# Patient Record
Sex: Female | Born: 1940
Health system: Southern US, Community
[De-identification: ages and names within clinical notes are randomized; demographics above are authoritative.]

## PROBLEM LIST (undated history)

## (undated) DIAGNOSIS — R7303 Prediabetes: Secondary | ICD-10-CM

## (undated) DIAGNOSIS — I4891 Unspecified atrial fibrillation: Secondary | ICD-10-CM

## (undated) DIAGNOSIS — K219 Gastro-esophageal reflux disease without esophagitis: Secondary | ICD-10-CM

## (undated) DIAGNOSIS — N183 Chronic kidney disease, stage 3 unspecified: Secondary | ICD-10-CM

## (undated) DIAGNOSIS — I10 Essential (primary) hypertension: Secondary | ICD-10-CM

## (undated) DIAGNOSIS — I480 Paroxysmal atrial fibrillation: Secondary | ICD-10-CM

## (undated) HISTORY — DX: Unspecified atrial fibrillation: I48.91

## (undated) HISTORY — DX: Paroxysmal atrial fibrillation: I48.0

## (undated) HISTORY — DX: Prediabetes: R73.03

## (undated) HISTORY — DX: Gastro-esophageal reflux disease without esophagitis: K21.9

## (undated) HISTORY — DX: Chronic kidney disease, stage 3 unspecified: N18.30

---

## 1998-05-14 ENCOUNTER — Other Ambulatory Visit: Admission: RE | Admit: 1998-05-14 | Discharge: 1998-05-14 | Payer: Self-pay | Admitting: Obstetrics and Gynecology

## 2003-07-18 ENCOUNTER — Encounter: Admission: RE | Admit: 2003-07-18 | Discharge: 2003-07-18 | Payer: Self-pay | Admitting: Family Medicine

## 2003-07-18 ENCOUNTER — Encounter: Payer: Self-pay | Admitting: Family Medicine

## 2004-03-13 ENCOUNTER — Ambulatory Visit (HOSPITAL_COMMUNITY): Admission: RE | Admit: 2004-03-13 | Discharge: 2004-03-13 | Payer: Self-pay | Admitting: Orthopedic Surgery

## 2012-05-02 ENCOUNTER — Other Ambulatory Visit: Payer: Self-pay | Admitting: Family Medicine

## 2012-05-05 ENCOUNTER — Ambulatory Visit
Admission: RE | Admit: 2012-05-05 | Discharge: 2012-05-05 | Disposition: A | Discharge status: Home / Self Care | Payer: BC Managed Care – PPO | Source: Ambulatory Visit | Attending: Family Medicine | Admitting: Family Medicine

## 2016-06-01 ENCOUNTER — Emergency Department (HOSPITAL_COMMUNITY): Payer: Medicare Other

## 2016-06-01 ENCOUNTER — Emergency Department (HOSPITAL_COMMUNITY)
Admission: EM | Admit: 2016-06-01 | Discharge: 2016-06-01 | Disposition: A | Discharge status: Home / Self Care | Payer: Medicare Other | Attending: Emergency Medicine | Admitting: Emergency Medicine

## 2016-06-01 ENCOUNTER — Encounter (HOSPITAL_COMMUNITY): Payer: Self-pay | Admitting: Emergency Medicine

## 2016-06-01 DIAGNOSIS — Y92410 Unspecified street and highway as the place of occurrence of the external cause: Secondary | ICD-10-CM | POA: Insufficient documentation

## 2016-06-01 DIAGNOSIS — Y939 Activity, unspecified: Secondary | ICD-10-CM | POA: Diagnosis not present

## 2016-06-01 DIAGNOSIS — S20212A Contusion of left front wall of thorax, initial encounter: Secondary | ICD-10-CM | POA: Diagnosis not present

## 2016-06-01 DIAGNOSIS — Z79899 Other long term (current) drug therapy: Secondary | ICD-10-CM | POA: Diagnosis not present

## 2016-06-01 DIAGNOSIS — Z7982 Long term (current) use of aspirin: Secondary | ICD-10-CM | POA: Diagnosis not present

## 2016-06-01 DIAGNOSIS — S299XXA Unspecified injury of thorax, initial encounter: Secondary | ICD-10-CM | POA: Diagnosis present

## 2016-06-01 DIAGNOSIS — Y999 Unspecified external cause status: Secondary | ICD-10-CM | POA: Diagnosis not present

## 2016-06-01 DIAGNOSIS — I10 Essential (primary) hypertension: Secondary | ICD-10-CM | POA: Diagnosis not present

## 2016-06-01 HISTORY — DX: Essential (primary) hypertension: I10

## 2016-06-01 LAB — CBC WITH DIFFERENTIAL/PLATELET
BASOS ABS: 0 10*3/uL (ref 0.0–0.1)
BASOS PCT: 0 %
Eosinophils Absolute: 0.1 10*3/uL (ref 0.0–0.7)
Eosinophils Relative: 1 %
HEMATOCRIT: 40.7 % (ref 36.0–46.0)
HEMOGLOBIN: 13.6 g/dL (ref 12.0–15.0)
Lymphocytes Relative: 38 %
Lymphs Abs: 2.4 10*3/uL (ref 0.7–4.0)
MCH: 28.2 pg (ref 26.0–34.0)
MCHC: 33.4 g/dL (ref 30.0–36.0)
MCV: 84.4 fL (ref 78.0–100.0)
Monocytes Absolute: 0.5 10*3/uL (ref 0.1–1.0)
Monocytes Relative: 8 %
NEUTROS ABS: 3.3 10*3/uL (ref 1.7–7.7)
NEUTROS PCT: 53 %
Platelets: 223 10*3/uL (ref 150–400)
RBC: 4.82 MIL/uL (ref 3.87–5.11)
RDW: 12.6 % (ref 11.5–15.5)
WBC: 6.3 10*3/uL (ref 4.0–10.5)

## 2016-06-01 LAB — BASIC METABOLIC PANEL
ANION GAP: 5 (ref 5–15)
BUN: 14 mg/dL (ref 6–20)
CALCIUM: 9.1 mg/dL (ref 8.9–10.3)
CHLORIDE: 108 mmol/L (ref 101–111)
CO2: 26 mmol/L (ref 22–32)
Creatinine, Ser: 1.25 mg/dL — ABNORMAL HIGH (ref 0.44–1.00)
GFR calc non Af Amer: 41 mL/min — ABNORMAL LOW (ref >60)
GFR, EST AFRICAN AMERICAN: 48 mL/min — AB (ref >60)
Glucose, Bld: 124 mg/dL — ABNORMAL HIGH (ref 65–99)
Potassium: 3.9 mmol/L (ref 3.5–5.1)
Sodium: 139 mmol/L (ref 135–145)

## 2016-06-01 LAB — TROPONIN I: Troponin I: <0.03 ng/mL (ref <0.03)

## 2016-06-01 MED ORDER — NAPROXEN 375 MG PO TABS: 375.0000 mg | ORAL_TABLET | Freq: Two times a day (BID) | ORAL | Status: DC
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
## 2016-06-01 NOTE — ED Notes (Signed)
MD at bedside. 
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
## 2018-01-17 ENCOUNTER — Ambulatory Visit: Payer: Self-pay | Admitting: Podiatry

## 2018-01-24 ENCOUNTER — Ambulatory Visit: Payer: Self-pay | Admitting: Podiatry

## 2018-01-31 ENCOUNTER — Ambulatory Visit: Payer: Medicare Other | Admitting: Podiatry

## 2018-01-31 ENCOUNTER — Encounter: Payer: Self-pay | Admitting: Podiatry

## 2018-01-31 ENCOUNTER — Ambulatory Visit (INDEPENDENT_AMBULATORY_CARE_PROVIDER_SITE_OTHER): Payer: Medicare Other

## 2018-01-31 VITALS — BP 110/64 | HR 58 | Resp 18

## 2018-01-31 DIAGNOSIS — M2010 Hallux valgus (acquired), unspecified foot: Secondary | ICD-10-CM | POA: Diagnosis not present

## 2018-01-31 DIAGNOSIS — M2041 Other hammer toe(s) (acquired), right foot: Secondary | ICD-10-CM

## 2018-01-31 DIAGNOSIS — M79674 Pain in right toe(s): Secondary | ICD-10-CM

## 2018-01-31 DIAGNOSIS — L84 Corns and callosities: Secondary | ICD-10-CM

## 2018-01-31 NOTE — Progress Notes (Signed)
   Subjective:    Patient ID: Elizabeth Russell, female    DOB: 1941-04-08, 77 y.o.   MRN: [id number]  HPI 77 year old female presents the office today for concerns of a corn to the top of her right second toe which is been ongoing for about 1 month.  She is tried some offloading pads but this is not been helping.  She denies any recent redness or drainage coming from the area she denies any swelling.  No recent injury or trauma.  No other concerns.   Review of Systems  All other systems reviewed and are negative.  Past Medical History:  Diagnosis Date  . Hypertension     History reviewed. No pertinent surgical history.   Current Outpatient Medications:  .  aspirin EC 81 MG tablet, Take 81 mg by mouth every other day., Disp: , Rfl:  .  losartan (COZAAR) 100 MG tablet, Take 100 mg by mouth daily., Disp: , Rfl:  .  naproxen (NAPROSYN) 375 MG tablet, Take 1 tablet (375 mg total) by mouth 2 (two) times daily. (Patient not taking: Reported on 01/31/2018), Disp: 20 tablet, Rfl: 0 .  pravastatin (PRAVACHOL) 80 MG tablet, Take 80 mg by mouth daily., Disp: , Rfl:   No Known Allergies  Social History   Socioeconomic History  . Marital status: Married    Spouse name: Not on file  . Number of children: Not on file  . Years of education: Not on file  . Highest education level: Not on file  Social Needs  . Financial resource strain: Not on file  . Food insecurity - worry: Not on file  . Food insecurity - inability: Not on file  . Transportation needs - medical: Not on file  . Transportation needs - non-medical: Not on file  Occupational History  . Not on file  Tobacco Use  . Smoking status: Never Smoker  . Smokeless tobacco: Never Used  Substance and Sexual Activity  . Alcohol use: No  . Drug use: No  . Sexual activity: Not on file  Other Topics Concern  . Not on file  Social History Narrative  . Not on file        Objective:   Physical Exam  General: AAO x3,  NAD  Dermatological: Hyperkeratotic lesion present the dorsal PIPJ.  Upon debridement there is no underlying ulceration, drainage or any clinical signs of infection present.  No other open lesions or pre-ulcerative lesions identified today.  Vascular: Dorsalis Pedis artery and Posterior Tibial artery pedal pulses are 2/4 bilateral with immedate capillary fill time. There is no pain with calf compression, swelling, warmth, erythema.   Neruologic: Grossly intact via light touch bilateral.  Protective threshold with Semmes Wienstein monofilament intact to all pedal sites bilateral.   Musculoskeletal: HAV is present as well as hammertoe deformity.  Muscular strength 5/5 in all groups tested bilateral.  Gait: Unassisted, Nonantalgic.     Assessment & Plan:  77 year old female with right second digit corn due to underlying hammertoe deformity -Treatment options discussed including all alternatives, risks, and complications -Etiology of symptoms were discussed -X-rays were obtained and reviewed with the patient.  HAV and hammertoes present.  No evidence of acute fracture. -I sharply debrided the hyperkeratotic lesion x1 without any complications or bleeding.  Dispensed offloading pads.  Discussed shoe gear modifications.  Vivi BarrackMatthew R Wagoner DPM

## 2018-01-31 NOTE — Patient Instructions (Signed)
Corns and Calluses Corns are small areas of thickened skin that occur on the top, sides, or tip of a toe. They contain a cone-shaped core with a point that can press on a nerve below. This causes pain. Calluses are areas of thickened skin that can occur anywhere on the body including hands, fingers, palms, soles of the feet, and heels.Calluses are usually larger than corns. What are the causes? Corns and calluses are caused by rubbing (friction) or pressure, such as from shoes that are too tight or do not fit properly. What increases the risk? Corns are more likely to develop in people who have toe deformities, such as hammer toes. Since calluses can occur with friction to any area of the skin, calluses are more likely to develop in people who:  Work with their hands.  Wear shoes that fit poorly, shoes that are too tight, or shoes that are high-heeled.  Have toes deformities.  What are the signs or symptoms? Symptoms of a corn or callus include:  A hard growth on the skin.  Pain or tenderness under the skin.  Redness and swelling.  Increased discomfort while wearing tight-fitting shoes.  How is this diagnosed? Corns and calluses may be diagnosed with a medical history and physical exam. How is this treated? Corns and calluses may be treated with:  Removing the cause of the friction or pressure. This may include: ? Changing your shoes. ? Wearing shoe inserts (orthotics) or other protective layers in your shoes, such as a corn pad. ? Wearing gloves.  Medicines to help soften skin in the hardened, thickened areas.  Reducing the size of the corn or callus by removing the dead layers of skin.  Antibiotic medicines to treat infection.  Surgery, if a toe deformity is the cause.  Follow these instructions at home:  Take medicines only as directed by your health care provider.  If you were prescribed an antibiotic, finish all of it even if you start to feel better.  Wear  shoes that fit well. Avoid wearing high-heeled shoes and shoes that are too tight or too loose.  Wear any padding, protective layers, gloves, or orthotics as directed by your health care provider.  Soak your hands or feet and then use a file or pumice stone to soften your corn or callus. Do this as directed by your health care provider.  Check your corn or callus every day for signs of infection. Watch for: ? Redness, swelling, or pain. ? Fluid, blood, or pus. Contact a health care provider if:  Your symptoms do not improve with treatment.  You have increased redness, swelling, or pain at the site of your corn or callus.  You have fluid, blood, or pus coming from your corn or callus.  You have new symptoms. This information is not intended to replace advice given to you by your health care provider. Make sure you discuss any questions you have with your health care provider. Document Released: 08/08/2004 Document Revised: 05/22/2016 Document Reviewed: 10/29/2014 Elsevier Interactive Patient Education  2018 Elsevier Inc.  Hammer Toe Hammer toe is a change in the shape (a deformity) of your second, third, or fourth toe. The deformity causes the middle joint of your toe to stay bent. This causes pain, especially when you are wearing shoes. Hammer toe starts gradually. At first, the toe can be straightened. Gradually over time, the deformity becomes stiff and permanent. Early treatments to keep the toe straight may relieve pain. As the deformity becomes stiff   and permanent, surgery may be needed to straighten the toe. What are the causes? Hammer toe is caused by abnormal bending of the toe joint that is closest to your foot. It happens gradually over time. This pulls on the muscles and connections (tendons) of the toe joint, making them weak and stiff. It is often related to wearing shoes that are too short or narrow and do not let your toes straighten. What increases the risk? You may be at  greater risk for hammer toe if you:  Are female.  Are older.  Wear shoes that are too small.  Wear high-heeled shoes that pinch your toes.  Are a ballet dancer.  Have a second toe that is longer than your big toe (first toe).  Injure your foot or toe.  Have arthritis.  Have a family history of hammer toe.  Have a nerve or muscle disorder.  What are the signs or symptoms? The main symptoms of this condition are pain and deformity of the toe. The pain is worse when wearing shoes, walking, or running. Other symptoms may include:  Corns or calluses over the bent part of the toe or between the toes.  Redness and a burning feeling on the toe.  An open sore that forms on the top of the toe.  Not being able to straighten the toe.  How is this diagnosed? This condition is diagnosed based on your symptoms and a physical exam. During the exam, your health care provider will try to straighten your toe to see how stiff the deformity is. You may also have tests, such as:  A blood test to check for rheumatoid arthritis.  An X-ray to show how severe the deformity is.  How is this treated? Treatment for this condition will depend on how stiff the deformity is. Surgery is often needed. However, sometimes a hammer toe can be straightened without surgery. Treatments that do not involve surgery include:  Taping the toe into a straightened position.  Using pads and cushions to protect the toe (orthotics).  Wearing shoes that provide enough room for the toes.  Doing toe-stretching exercises at home.  Taking an NSAID to reduce pain and swelling.  If these treatments do not help or the toe cannot be straightened, surgery is the next option. The most common surgeries used to straighten a hammer toe include:  Arthroplasty. In this procedure, part of the joint is removed, and that allows the toe to straighten.  Fusion. In this procedure, cartilage between the two bones of the joint is  taken out and the bones are fused together into one longer bone.  Implantation. In this procedure, part of the bone is removed and replaced with an implant to let the toe move again.  Flexor tendon transfer. In this procedure, the tendons that curl the toes down (flexor tendons) are repositioned.  Follow these instructions at home:  Take over-the-counter and prescription medicines only as told by your health care provider.  Do toe straightening and stretching exercises as told by your health care provider.  Keep all follow-up visits as told by your health care provider. This is important. How is this prevented?  Wear shoes that give your toes enough room and do not cause pain.  Do not wear high-heeled shoes. Contact a health care provider if:  Your pain gets worse.  Your toe becomes red or swollen.  You develop an open sore on your toe. This information is not intended to replace advice given to you   by your health care provider. Make sure you discuss any questions you have with your health care provider. Document Released: 10/30/2000 Document Revised: 05/22/2016 Document Reviewed: 02/26/2016 Elsevier Interactive Patient Education  2018 Elsevier Inc.  

## 2019-09-27 ENCOUNTER — Other Ambulatory Visit: Payer: Self-pay

## 2019-09-27 DIAGNOSIS — Z20822 Contact with and (suspected) exposure to covid-19: Secondary | ICD-10-CM

## 2019-09-28 ENCOUNTER — Telehealth: Payer: Self-pay | Admitting: General Practice

## 2019-09-28 LAB — NOVEL CORONAVIRUS, NAA: SARS-CoV-2, NAA: NOT DETECTED

## 2019-09-28 NOTE — Telephone Encounter (Signed)
Pt called in for covid result.  °Advised of Not Detected result.  °

## 2019-11-17 ENCOUNTER — Other Ambulatory Visit: Payer: Self-pay

## 2019-11-17 ENCOUNTER — Emergency Department (HOSPITAL_COMMUNITY): Payer: Medicare Other

## 2019-11-17 ENCOUNTER — Emergency Department (HOSPITAL_COMMUNITY)
Admission: EM | Admit: 2019-11-17 | Discharge: 2019-11-18 | Disposition: A | Discharge status: Home / Self Care | Payer: Medicare Other | Attending: Emergency Medicine | Admitting: Emergency Medicine

## 2019-11-17 ENCOUNTER — Encounter (HOSPITAL_COMMUNITY): Payer: Self-pay | Admitting: Emergency Medicine

## 2019-11-17 DIAGNOSIS — Z79899 Other long term (current) drug therapy: Secondary | ICD-10-CM | POA: Insufficient documentation

## 2019-11-17 DIAGNOSIS — R0789 Other chest pain: Secondary | ICD-10-CM | POA: Diagnosis present

## 2019-11-17 DIAGNOSIS — Z7982 Long term (current) use of aspirin: Secondary | ICD-10-CM | POA: Diagnosis not present

## 2019-11-17 DIAGNOSIS — R079 Chest pain, unspecified: Secondary | ICD-10-CM

## 2019-11-17 DIAGNOSIS — I10 Essential (primary) hypertension: Secondary | ICD-10-CM | POA: Diagnosis not present

## 2019-11-17 LAB — CBC
HCT: 43.7 % (ref 36.0–46.0)
Hemoglobin: 14.7 g/dL (ref 12.0–15.0)
MCH: 28.7 pg (ref 26.0–34.0)
MCHC: 33.6 g/dL (ref 30.0–36.0)
MCV: 85.4 fL (ref 80.0–100.0)
Platelets: 230 10*3/uL (ref 150–400)
RBC: 5.12 MIL/uL — ABNORMAL HIGH (ref 3.87–5.11)
RDW: 12.5 % (ref 11.5–15.5)
WBC: 7.3 10*3/uL (ref 4.0–10.5)
nRBC: 0 % (ref 0.0–0.2)

## 2019-11-17 LAB — BASIC METABOLIC PANEL
Anion gap: 9 (ref 5–15)
BUN: 13 mg/dL (ref 8–23)
CO2: 23 mmol/L (ref 22–32)
Calcium: 9.5 mg/dL (ref 8.9–10.3)
Chloride: 106 mmol/L (ref 98–111)
Creatinine, Ser: 1.13 mg/dL — ABNORMAL HIGH (ref 0.44–1.00)
GFR calc Af Amer: 54 mL/min — ABNORMAL LOW (ref >60)
GFR calc non Af Amer: 47 mL/min — ABNORMAL LOW (ref >60)
Glucose, Bld: 155 mg/dL — ABNORMAL HIGH (ref 70–99)
Potassium: 3.6 mmol/L (ref 3.5–5.1)
Sodium: 138 mmol/L (ref 135–145)

## 2019-11-17 LAB — TROPONIN I (HIGH SENSITIVITY): Troponin I (High Sensitivity): 12 ng/L (ref <18)

## 2019-11-17 MED ORDER — SODIUM CHLORIDE 0.9% FLUSH: 3.0000 mL | Freq: Once | INTRAVENOUS | Status: DC

## 2019-11-17 NOTE — ED Triage Notes (Signed)
Patient reports pain across her chest onset this evening , denies SOB , no emesis or diaphoresis , no cough or fever .

## 2019-11-18 ENCOUNTER — Encounter (HOSPITAL_COMMUNITY): Payer: Self-pay | Admitting: Emergency Medicine

## 2019-11-18 LAB — TROPONIN I (HIGH SENSITIVITY): Troponin I (High Sensitivity): 13 ng/L (ref <18)

## 2019-11-18 NOTE — ED Provider Notes (Signed)
[facility] EMERGENCY DEPARTMENT Provider Note   CSN: [id number] Arrival date & time: 11/17/19  2114     History Chief Complaint  Patient presents with  . Chest Pain    Elizabeth Russell is a 79 y.o. female.  The history is provided by the patient.  Chest Pain Pain location:  R chest Pain severity:  Mild Onset quality:  Sudden Duration:  12 hours Progression:  Resolved Chronicity:  New Context: eating   Relieved by:  Antacids Worsened by:  Nothing Associated symptoms: heartburn and palpitations   Associated symptoms: no abdominal pain, no altered mental status, no back pain, no cough, no fatigue, no fever, no shortness of breath and no vomiting   Risk factors: hypertension   Risk factors: no coronary artery disease, no diabetes mellitus, no high cholesterol, no prior DVT/PE and no smoking        Past Medical History:  Diagnosis Date  . Hypertension     There are no problems to display for this patient.   History reviewed. No pertinent surgical history.   OB History   No obstetric history on file.     History reviewed. No pertinent family history.  Social History   Tobacco Use  . Smoking status: Never Smoker  . Smokeless tobacco: Never Used  Substance Use Topics  . Alcohol use: No  . Drug use: No    Home Medications Prior to Admission medications   Medication Sig Start Date End Date Taking? Authorizing Provider  aspirin EC 81 MG tablet Take 81 mg by mouth every other day.    [provider]  losartan (COZAAR) 100 MG tablet Take 100 mg by mouth daily.    [provider]  naproxen (NAPROSYN) 375 MG tablet Take 1 tablet (375 mg total) by mouth 2 (two) times daily. Patient not taking: Reported on 01/31/2018 06/01/16   Derwood Kaplan, MD  pravastatin (PRAVACHOL) 80 MG tablet Take 80 mg by mouth daily.    [provider]    Allergies    Patient has no known allergies.  Review of Systems   Review of Systems   Constitutional: Negative for chills, fatigue and fever.  HENT: Negative for ear pain and sore throat.   Eyes: Negative for pain and visual disturbance.  Respiratory: Negative for cough and shortness of breath.   Cardiovascular: Positive for chest pain and palpitations.  Gastrointestinal: Positive for heartburn. Negative for abdominal pain and vomiting.  Genitourinary: Negative for dysuria and hematuria.  Musculoskeletal: Negative for arthralgias and back pain.  Skin: Negative for color change and rash.  Neurological: Negative for seizures and syncope.  All other systems reviewed and are negative.   Physical Exam Updated Vital Signs  ED Triage Vitals  Enc Vitals Group     BP 11/17/19 2127 (!) 155/84     Pulse Rate 11/17/19 2127 79     Resp 11/17/19 2127 19     Temp 11/17/19 2127 97.9 F (36.6 C)     Temp Source 11/17/19 2127 Oral     SpO2 11/17/19 2127 100 %     Weight --      Height --      Head Circumference --      Peak Flow --      Pain Score 11/17/19 2120 8     Pain Loc --      Pain Edu? --      Excl. in GC? --     Physical Exam Vitals  and nursing note reviewed.  Constitutional:      General: She is not in acute distress.    Appearance: She is well-developed.  HENT:     Head: Normocephalic and atraumatic.  Eyes:     Conjunctiva/sclera: Conjunctivae normal.  Cardiovascular:     Rate and Rhythm: Normal rate and regular rhythm.     Pulses:          Radial pulses are 2+ on the right side and 2+ on the left side.     Heart sounds: Normal heart sounds. No murmur.  Pulmonary:     Effort: Pulmonary effort is normal. No respiratory distress.     Breath sounds: Normal breath sounds. No decreased breath sounds, wheezing, rhonchi or rales.  Abdominal:     Palpations: Abdomen is soft.     Tenderness: There is no abdominal tenderness.  Musculoskeletal:        General: Normal range of motion.     Cervical back: Neck supple.     Right lower leg: No edema.     Left  lower leg: No edema.  Skin:    General: Skin is warm and dry.     Capillary Refill: Capillary refill takes less than 2 seconds.  Neurological:     General: No focal deficit present.     Mental Status: She is alert.  Psychiatric:        Mood and Affect: Mood normal.     ED Results / Procedures / Treatments   Labs (all labs ordered are listed, but only abnormal results are displayed) Labs Reviewed  BASIC METABOLIC PANEL - Abnormal; Notable for the following components:      Result Value   Glucose, Bld 155 (*)    Creatinine, Ser 1.13 (*)    GFR calc non Af Amer 47 (*)    GFR calc Af Amer 54 (*)    All other components within normal limits  CBC - Abnormal; Notable for the following components:   RBC 5.12 (*)    All other components within normal limits  TROPONIN I (HIGH SENSITIVITY)  TROPONIN I (HIGH SENSITIVITY)    EKG EKG Interpretation  Date/Time:  Friday November 17 2019 21:22:23 EST Ventricular Rate:  85 PR Interval:  178 QRS Duration: 72 QT Interval:  338 QTC Calculation: 402 R Axis:   19 Text Interpretation: Normal sinus rhythm Septal infarct , age undetermined Abnormal ECG Non-specific change in ST segment in III v3-v5 twave inversion IIIk v3 changes are new since first prior ekg of 01 June 2016 Confirmed by Pattricia Boss [phone]) on 11/18/2019 7:00:23 AM   Radiology DG Chest 2 View  Result Date: 11/17/2019 CLINICAL DATA:  Chest pain EXAM: CHEST - 2 VIEW COMPARISON:  June 01, 2016 FINDINGS: The heart size and mediastinal contours are within normal limits. Both lungs are clear. The visualized skeletal structures are unremarkable. Aortic calcifications are noted. IMPRESSION: No active cardiopulmonary disease. Electronically Signed   By: Constance Holster M.D.   On: 11/17/2019 22:25    Procedures Procedures (including critical care time)  Medications Ordered in ED Medications  sodium chloride flush (NS) 0.9 % injection 3 mL (has no administration in time range)     ED Course  I have reviewed the triage vital signs and the nursing notes.  Pertinent labs & imaging results that were available during my care of the patient were reviewed by me and considered in my medical decision making (see chart for details).    MDM  Rules/Calculators/A&P  Elizabeth Russell is a 79 year old female with history of hypertension who presents to the ED with chest pain.  Pain started last night after eating.  Patient with normal vitals.  No fever.  Took an antacid with improvement but did not have improvement very quickly.  No chest pain currently.  No shortness of breath.  No infectious symptoms.  EKG shows sinus rhythm.  No ischemic changes.  2 troponins several hours apart are within normal limits.  No significant anemia, electrolyte abnormality, kidney injury.  Patient is Wells criteria 0 and doubt PE.  Heart score is 3 and given normal troponins and EKG unlikely ACS.  Chest x-ray showed no signs of infection, pneumothorax, or pleural effusion.  Overall atypical story.  She mostly felt like she had palpitations which she has a history of extra beats.  Likely PVCs or PACs.  Overall asymptomatic now.  Could be reflux related as well.  Recommend close follow-up with primary care doctor given return precautions.  Discharged in the ED in good condition.  This chart was dictated using voice recognition software.  Despite best efforts to proofread,  errors can occur which can change the documentation meaning.     Final Clinical Impression(s) / ED Diagnoses Final diagnoses:  Nonspecific chest pain    Rx / DC Orders ED Discharge Orders    None       Virgina Norfolk, DO 11/18/19 7915

## 2019-12-06 ENCOUNTER — Ambulatory Visit: Payer: Medicare Other | Attending: Internal Medicine

## 2019-12-06 DIAGNOSIS — Z23 Encounter for immunization: Secondary | ICD-10-CM | POA: Insufficient documentation

## 2019-12-06 NOTE — Progress Notes (Signed)
   Covid-19 Vaccination Clinic  Name:  Elizabeth Russell    MRN: [id number] DOB: [birth year]  12/06/2019  [name] was observed post Covid-19 immunization for 15 minutes without incidence. She was provided with Vaccine Information Sheet and instruction to access the V-Safe system.   [name] was instructed to call 911 with any severe reactions post vaccine: Marland Kitchen Difficulty breathing  . Swelling of your face and throat  . A fast heartbeat  . A bad rash all over your body  . Dizziness and weakness    Immunizations Administered    Name Date Dose VIS Date Route   Pfizer COVID-19 Vaccine 12/06/2019  1:58 PM 0.3 mL 10/27/2019 Intramuscular   Manufacturer: [employer], [employer]   Lot: [id number]   NDC: [address]-0

## 2019-12-27 ENCOUNTER — Ambulatory Visit: Payer: Medicare Other | Attending: Internal Medicine

## 2019-12-27 DIAGNOSIS — Z23 Encounter for immunization: Secondary | ICD-10-CM | POA: Insufficient documentation

## 2019-12-27 NOTE — Progress Notes (Signed)
   Covid-19 Vaccination Clinic  Name:  KAMONI DEPREE    MRN: [id number] DOB: 05-Aug-1941  12/27/2019  Ms. Quesada was observed post Covid-19 immunization for 15 minutes without incidence. She was provided with Vaccine Information Sheet and instruction to access the V-Safe system.   Ms. Wigley was instructed to call 911 with any severe reactions post vaccine: Marland Kitchen Difficulty breathing  . Swelling of your face and throat  . A fast heartbeat  . A bad rash all over your body  . Dizziness and weakness    Immunizations Administered    Name Date Dose VIS Date Route   Pfizer COVID-19 Vaccine 12/27/2019  3:56 PM 0.3 mL 10/27/2019 Intramuscular   Manufacturer: [employer], [employer]   Lot: [id number]   NDC: [address]-6

## 2020-10-14 ENCOUNTER — Ambulatory Visit: Payer: Medicare Other | Attending: Internal Medicine

## 2020-10-14 DIAGNOSIS — Z23 Encounter for immunization: Secondary | ICD-10-CM

## 2020-10-14 NOTE — Progress Notes (Signed)
   Covid-19 Vaccination Clinic  Name:  Elizabeth Russell    MRN: [id number] DOB: February 10, 1941  10/14/2020  [name] was observed post Covid-19 immunization for 15 minutes without incident. She was provided with Vaccine Information Sheet and instruction to access the V-Safe system.   [name] was instructed to call 911 with any severe reactions post vaccine: Marland Kitchen Difficulty breathing  . Swelling of face and throat  . A fast heartbeat  . A bad rash all over body  . Dizziness and weakness   Immunizations Administered    Name Date Dose VIS Date Route   Pfizer COVID-19 Vaccine 10/14/2020  1:19 PM 0.3 mL 09/04/2020 Intramuscular   Manufacturer: [employer], [employer]   Lot: [id number]   NDC: [address]-5

## 2020-11-24 NOTE — Progress Notes (Signed)
Cardiology Office Note:    Date:  11/26/2020   ID:  Elizabeth Russell, Elizabeth Russell 24-Apr-1941, MRN [id number]  PCP:  Harlan Stains, MD  [place] Cardiologist:  No primary care provider on file.  [facility] Electrophysiologist:  None   Referring MD: Harlan Stains, MD    History of Present Illness:    Elizabeth Russell is a 80 y.o. female with a hx of HTN, DMII, GERD, CKD stage 3, and anxiety who was referred by Dr. Dema Severin for episode of chest pain as well as weakness/dizziness.   Patient presented to [facility] on 11/18/19 with chest pain that began the night prior after eating. In ED, trop negative x2, ECG with NSR and no ischemic changes, CXR with no acute findings. She was given an antiacid with improvement and referred back to her PCP for further management. At her PCPs office, she was complaining of intermittent episodes of lightheadedness and hypotension for which she was referred to Cardiology clinic.  Patient states that she has intermittent episodes where she feels like she needs to stop what she is doing, sit down, and catch her breath in order to prevent her from passing out. Last episode was on New Years and her blood pressure at the time 84/47-->92/49. States these episodes only occur when she "feels like air gets stuck in her throat." No associated palpitations, chest pains, nausea, diaphoresis. Symptoms resolve with sitting down. All episodes occur when she is on her feet working and do not occur when sitting or resting. She has not lost consciousness. She does take losartan 54m daily for hypertension. Blood pressures mainly 110s at home. She drinks water throughout the day and avoids salt in her diet as much as possible.  A1C 5.9, Cr 1.22, eGFR 51, TSH 1.73 TC 186, HDL 69, LDL 102, TG 81  Past Medical History:  Diagnosis Date  . Hypertension     No past surgical history on file.  Current Medications: Current Meds  Medication Sig  . famotidine (PEPCID) 40 MG tablet Take 40 mg by mouth  as needed.  .Marland Kitchenlosartan (COZAAR) 25 MG tablet Take 1 tablet (25 mg total) by mouth daily.  . pravastatin (PRAVACHOL) 80 MG tablet Take 80 mg by mouth daily.  . [DISCONTINUED] losartan (COZAAR) 50 MG tablet Take 50 mg by mouth daily.     Allergies:   Patient has no known allergies.   Social History   Socioeconomic History  . Marital status: Married    Spouse name: Not on file  . Number of children: Not on file  . Years of education: Not on file  . Highest education level: Not on file  Occupational History  . Not on file  Tobacco Use  . Smoking status: Never Smoker  . Smokeless tobacco: Never Used  Vaping Use  . Vaping Use: Never used  Substance and Sexual Activity  . Alcohol use: No  . Drug use: No  . Sexual activity: Not on file  Other Topics Concern  . Not on file  Social History Narrative  . Not on file   Social Determinants of Health   Financial Resource Strain: Not on file  Food Insecurity: Not on file  Transportation Needs: Not on file  Physical Activity: Not on file  Stress: Not on file  Social Connections: Not on file     Family History: The patient's family history is not on file.  ROS:   Please see the history of present illness.    Review  of Systems  Constitutional: Negative for chills and fever.  HENT: Negative for congestion.   Eyes: Negative for blurred vision.  Respiratory: Positive for shortness of breath.   Cardiovascular: Negative for chest pain, palpitations, orthopnea, claudication, leg swelling and PND.  Gastrointestinal: Negative for nausea and vomiting.  Genitourinary: Negative for frequency.  Musculoskeletal: Negative for falls and myalgias.  Neurological: Positive for dizziness. Negative for loss of consciousness.  Psychiatric/Behavioral: The patient is nervous/anxious.     EKGs/Labs/Other Studies Reviewed:    The following studies were reviewed today: CXR 2019-12-08:  FINDINGS: The heart size and mediastinal contours are within  normal limits. Both lungs are clear. The visualized skeletal structures are unremarkable. Aortic calcifications are noted.  IMPRESSION: No active cardiopulmonary disease.  EKG:  EKG is ordered today.  The ekg ordered today demonstrates sinus bradycardia with HR 53.  Recent Labs: No results found for requested labs within last 8760 hours.  Recent Lipid Panel No results found for: CHOL, TRIG, HDL, CHOLHDL, VLDL, LDLCALC, LDLDIRECT   Physical Exam:    VS:  BP 116/74   Pulse 63   Ht _0  (1.626 m)   Wt 156 lb 3.2 oz (70.9 kg)   SpO2 99%   BMI 26.81 kg/m     Wt Readings from Last 3 Encounters:  11/26/20 156 lb 3.2 oz (70.9 kg)     GEN:  Well nourished, well developed in no acute distress HEENT: Normal NECK: No JVD; No carotid bruits CARDIAC: Bradycardic, regular, no murmurs, rubs, gallops RESPIRATORY:  Clear to auscultation without rales, wheezing or rhonchi  ABDOMEN: Soft, non-tender, non-distended MUSCULOSKELETAL:  No edema; No deformity  SKIN: Warm and dry NEUROLOGIC:  Alert and oriented x 3 PSYCHIATRIC:  Normal affect   ASSESSMENT:    1. Orthostatic hypotension   2. Primary hypertension   3. Pure hypercholesterolemia   4. Type 1 diabetes mellitus without complication (HCC)   5. Dizziness    PLAN:    In order of problems listed above:  #Orthostatic Hypotension: Patient presents with intermittent episodes of lightheadedness after prolonged standing with associated hypotension with blood pressures in 80-90s/60s. Symptoms resolve with sitting down. No associated chest pain, palpitations, nausea/vomiting, diaphoresis. No LOC. She currently is on losartan for HTN with blood pressures mainly 110s at home. Suspect orthostatic hypotension given symptoms with standing and associated hypotension. Will decrease losartan dosing and start compression therapy and monitor for improvement.  -Decrease losartan to 32m daily  -Start compression socks -Continue fluid intake -If  symptoms recur, stop losartan all together -Discussed that if she feels faint, she needs to lay down and put her legs up to help with venous return -If continues to have symptoms despite above measures, will plan on zio patch   #HTN: Now with episodes of low blood pressure as above. -Decrease losartan to 246mdaily  #HLD: -Continue pravastatin  #DMII: Diet controlled and managed by PCP. -Follow-up with PCP as scheduled  #CKD Stage 3: Stable. -Follow-up with PCP as scheduled   Medication Adjustments/Labs and Tests Ordered: Current medicines are reviewed at length with the patient today.  Concerns regarding medicines are outlined above.  Orders Placed This Encounter  Procedures  . EKG 12-Lead   Meds ordered this encounter  Medications  . losartan (COZAAR) 25 MG tablet    Sig: Take 1 tablet (25 mg total) by mouth daily.    Dispense:  90 tablet    Refill:  3    Patient Instructions   Medication Instructions:  Your physician has recommended you make the following change in your medication:  1.  DECREASE the Losartan to 25 mg taking 1 tablet daily.  You may cut the 50 mg tablets in 1/2 to use them up.  I have sent in a new prescription for the 25 mg dose to your pharmacy.   *If you need a refill on your cardiac medications before your next appointment, please call your pharmacy*   Lab Work: None ordered  If you have labs (blood work) drawn today and your tests are completely normal, you will receive your results only by: Marland Kitchen MyChart Message (if you have MyChart) OR . A paper copy in the mail If you have any lab test that is abnormal or we need to change your treatment, we will call you to review the results.   Testing/Procedures: None ordered   Follow-Up: At [facility], you and your health needs are our priority.  As part of our continuing mission to provide you with exceptional heart care, we have created designated Provider Care Teams.  These Care Teams include  your primary Cardiologist (physician) and Advanced Practice Providers (APPs -  Physician Assistants and Nurse Practitioners) who all work together to provide you with the care you need, when you need it.  We recommend signing up for the patient portal called "MyChart".  Sign up information is provided on this After Visit Summary.  MyChart is used to connect with patients for Virtual Visits (Telemedicine).  Patients are able to view lab/test results, encounter notes, upcoming appointments, etc.  Non-urgent messages can be sent to your provider as well.   To learn more about what you can do with MyChart, go to [email or web address].    Your next appointment:   3 month(s)  The format for your next appointment:   In Person  Provider:   Gwyndolyn Kaufman, MD   Other Instructions  Orthostatic Hypotension Blood pressure is a measurement of how strongly, or weakly, your blood is pressing against the walls of your arteries. Orthostatic hypotension is a sudden drop in blood pressure that happens when you quickly change positions, such as when you get up from sitting or lying down. Arteries are blood vessels that carry blood from your heart throughout your body. When blood pressure is too low, you may not get enough blood to your brain or to the rest of your organs. This can cause weakness, light-headedness, rapid heartbeat, and fainting. This can last for just a few seconds or for up to a few minutes. Orthostatic hypotension is usually not a serious problem. However, if it happens frequently or gets worse, it may be a sign of something more serious. What are the causes? This condition may be caused by:  Sudden changes in posture, such as standing up quickly after you have been sitting or lying down.  Blood loss.  Loss of body fluids (dehydration).  Heart problems.  Hormone (endocrine) problems.  Pregnancy.  Severe infection.  Lack of certain nutrients.  Severe allergic reactions  (anaphylaxis).  Certain medicines, such as blood pressure medicine or medicines that make the body lose excess fluids (diuretics). Sometimes, this condition can be caused by not taking medicine as directed, such as taking too much of a certain medicine. What increases the risk? The following factors may make you more likely to develop this condition:  Age. Risk increases as you get older.  Conditions that affect the heart or the central nervous system.  Taking certain medicines, such as blood pressure  medicine or diuretics.  Being pregnant. What are the signs or symptoms? Symptoms of this condition may include:  Weakness.  Light-headedness.  Dizziness.  Blurred vision.  Fatigue.  Rapid heartbeat.  Fainting, in severe cases. How is this diagnosed? This condition is diagnosed based on:  Your medical history.  Your symptoms.  Your blood pressure measurement. Your health care provider will check your blood pressure when you are: ? Lying down. ? Sitting. ? Standing. A blood pressure reading is recorded as two numbers, such as "120 over 80" (or 120/80). The first ("top") number is called the systolic pressure. It is a measure of the pressure in your arteries as your heart beats. The second ("bottom") number is called the diastolic pressure. It is a measure of the pressure in your arteries when your heart relaxes between beats. Blood pressure is measured in a unit called mm Hg. Healthy blood pressure for most adults is 120/80. If your blood pressure is below 90/60, you may be diagnosed with hypotension. Other information or tests that may be used to diagnose orthostatic hypotension include:  Your other vital signs, such as your heart rate and temperature.  Blood tests.  Tilt table test. For this test, you will be safely secured to a table that moves you from a lying position to an upright position. Your heart rhythm and blood pressure will be monitored during the test. How is  this treated? This condition may be treated by:  Changing your diet. This may involve eating more salt (sodium) or drinking more water.  Taking medicines to raise your blood pressure.  Changing the dosage of certain medicines you are taking that might be lowering your blood pressure.  Wearing compression stockings. These stockings help to prevent blood clots and reduce swelling in your legs. In some cases, you may need to go to the hospital for:  Fluid replacement. This means you will receive fluids through an IV.  Blood replacement. This means you will receive donated blood through an IV (transfusion).  Treating an infection or heart problems, if this applies.  Monitoring. You may need to be monitored while medicines that you are taking wear off. Follow these instructions at home: Eating and drinking  Drink enough fluid to keep your urine pale yellow.  Eat a healthy diet, and follow instructions from your health care provider about eating or drinking restrictions. A healthy diet includes: ? Fresh fruits and vegetables. ? Whole grains. ? Lean meats. ? Low-fat dairy products.  Eat extra salt only as directed. Do not add extra salt to your diet unless your health care provider told you to do that.  Eat frequent, small meals.  Avoid standing up suddenly after eating.   Medicines  Take over-the-counter and prescription medicines only as told by your health care provider. ? Follow instructions from your health care provider about changing the dosage of your current medicines, if this applies. ? Do not stop or adjust any of your medicines on your own. General instructions  Wear compression stockings as told by your health care provider.  Get up slowly from lying down or sitting positions. This gives your blood pressure a chance to adjust.  Avoid hot showers and excessive heat as directed by your health care provider.  Return to your normal activities as told by your health  care provider. Ask your health care provider what activities are safe for you.  Do not use any products that contain nicotine or tobacco, such as cigarettes, e-cigarettes, and chewing  tobacco. If you need help quitting, ask your health care provider.  Keep all follow-up visits as told by your health care provider. This is important.   Contact a health care provider if you:  Vomit.  Have diarrhea.  Have a fever for more than 2-3 days.  Feel more thirsty than usual.  Feel weak and tired. Get help right away if you:  Have chest pain.  Have a fast or irregular heartbeat.  Develop numbness in any part of your body.  Cannot move your arms or your legs.  Have trouble speaking.  Become sweaty or feel light-headed.  Faint.  Feel short of breath.  Have trouble staying awake.  Feel confused. Summary  Orthostatic hypotension is a sudden drop in blood pressure that happens when you quickly change positions.  Orthostatic hypotension is usually not a serious problem.  It is diagnosed by having your blood pressure taken lying down, sitting, and then standing.  It may be treated by changing your diet or adjusting your medicines. This information is not intended to replace advice given to you by your health care provider. Make sure you discuss any questions you have with your health care provider. Document Revised: 04/28/2018 Document Reviewed: 04/28/2018 Elsevier Patient Education  2021 [employer].      Signed, Freada Bergeron, MD  11/26/2020 11:33 AM    [place]

## 2020-11-26 ENCOUNTER — Ambulatory Visit: Payer: Medicare Other | Admitting: Cardiology

## 2020-11-26 ENCOUNTER — Other Ambulatory Visit: Payer: Self-pay

## 2020-11-26 ENCOUNTER — Encounter: Payer: Self-pay | Admitting: Cardiology

## 2020-11-26 VITALS — BP 116/74 | HR 63 | Ht 64.0 in | Wt 156.2 lb

## 2020-11-26 DIAGNOSIS — E7800 Pure hypercholesterolemia, unspecified: Secondary | ICD-10-CM | POA: Diagnosis not present

## 2020-11-26 DIAGNOSIS — I951 Orthostatic hypotension: Secondary | ICD-10-CM

## 2020-11-26 DIAGNOSIS — I10 Essential (primary) hypertension: Secondary | ICD-10-CM

## 2020-11-26 DIAGNOSIS — E109 Type 1 diabetes mellitus without complications: Secondary | ICD-10-CM | POA: Diagnosis not present

## 2020-11-26 DIAGNOSIS — R42 Dizziness and giddiness: Secondary | ICD-10-CM

## 2020-11-26 MED ORDER — LOSARTAN POTASSIUM 25 MG PO TABS: 25.0000 mg | ORAL_TABLET | Freq: Every day | ORAL | 3 refills | Status: DC

## 2020-11-26 NOTE — Patient Instructions (Signed)
Medication Instructions:  Your physician has recommended you make the following change in your medication:  1.  DECREASE the Losartan to 25 mg taking 1 tablet daily.  You may cut the 50 mg tablets in 1/2 to use them up.  I have sent in a new prescription for the 25 mg dose to your pharmacy.   *If you need a refill on your cardiac medications before your next appointment, please call your pharmacy*   Lab Work: None ordered  If you have labs (blood work) drawn today and your tests are completely normal, you will receive your results only by: Marland Kitchen MyChart Message (if you have MyChart) OR . A paper copy in the mail If you have any lab test that is abnormal or we need to change your treatment, we will call you to review the results.   Testing/Procedures: None ordered   Follow-Up: At [facility], you and your health needs are our priority.  As part of our continuing mission to provide you with exceptional heart care, we have created designated Provider Care Teams.  These Care Teams include your primary Cardiologist (physician) and Advanced Practice Providers (APPs -  Physician Assistants and Nurse Practitioners) who all work together to provide you with the care you need, when you need it.  We recommend signing up for the patient portal called "MyChart".  Sign up information is provided on this After Visit Summary.  MyChart is used to connect with patients for Virtual Visits (Telemedicine).  Patients are able to view lab/test results, encounter notes, upcoming appointments, etc.  Non-urgent messages can be sent to your provider as well.   To learn more about what you can do with MyChart, go to [email or web address].    Your next appointment:   3 month(s)  The format for your next appointment:   In Person  Provider:   Laurance Flatten, MD   Other Instructions  Orthostatic Hypotension Blood pressure is a measurement of how strongly, or weakly, your blood is pressing against the  walls of your arteries. Orthostatic hypotension is a sudden drop in blood pressure that happens when you quickly change positions, such as when you get up from sitting or lying down. Arteries are blood vessels that carry blood from your heart throughout your body. When blood pressure is too low, you may not get enough blood to your brain or to the rest of your organs. This can cause weakness, light-headedness, rapid heartbeat, and fainting. This can last for just a few seconds or for up to a few minutes. Orthostatic hypotension is usually not a serious problem. However, if it happens frequently or gets worse, it may be a sign of something more serious. What are the causes? This condition may be caused by:  Sudden changes in posture, such as standing up quickly after you have been sitting or lying down.  Blood loss.  Loss of body fluids (dehydration).  Heart problems.  Hormone (endocrine) problems.  Pregnancy.  Severe infection.  Lack of certain nutrients.  Severe allergic reactions (anaphylaxis).  Certain medicines, such as blood pressure medicine or medicines that make the body lose excess fluids (diuretics). Sometimes, this condition can be caused by not taking medicine as directed, such as taking too much of a certain medicine. What increases the risk? The following factors may make you more likely to develop this condition:  Age. Risk increases as you get older.  Conditions that affect the heart or the central nervous system.  Taking certain medicines, such as  blood pressure medicine or diuretics.  Being pregnant. What are the signs or symptoms? Symptoms of this condition may include:  Weakness.  Light-headedness.  Dizziness.  Blurred vision.  Fatigue.  Rapid heartbeat.  Fainting, in severe cases. How is this diagnosed? This condition is diagnosed based on:  Your medical history.  Your symptoms.  Your blood pressure measurement. Your health care provider  will check your blood pressure when you are: ? Lying down. ? Sitting. ? Standing. A blood pressure reading is recorded as two numbers, such as "120 over 80" (or 120/80). The first ("top") number is called the systolic pressure. It is a measure of the pressure in your arteries as your heart beats. The second ("bottom") number is called the diastolic pressure. It is a measure of the pressure in your arteries when your heart relaxes between beats. Blood pressure is measured in a unit called mm Hg. Healthy blood pressure for most adults is 120/80. If your blood pressure is below 90/60, you may be diagnosed with hypotension. Other information or tests that may be used to diagnose orthostatic hypotension include:  Your other vital signs, such as your heart rate and temperature.  Blood tests.  Tilt table test. For this test, you will be safely secured to a table that moves you from a lying position to an upright position. Your heart rhythm and blood pressure will be monitored during the test. How is this treated? This condition may be treated by:  Changing your diet. This may involve eating more salt (sodium) or drinking more water.  Taking medicines to raise your blood pressure.  Changing the dosage of certain medicines you are taking that might be lowering your blood pressure.  Wearing compression stockings. These stockings help to prevent blood clots and reduce swelling in your legs. In some cases, you may need to go to the hospital for:  Fluid replacement. This means you will receive fluids through an IV.  Blood replacement. This means you will receive donated blood through an IV (transfusion).  Treating an infection or heart problems, if this applies.  Monitoring. You may need to be monitored while medicines that you are taking wear off. Follow these instructions at home: Eating and drinking  Drink enough fluid to keep your urine pale yellow.  Eat a healthy diet, and follow  instructions from your health care provider about eating or drinking restrictions. A healthy diet includes: ? Fresh fruits and vegetables. ? Whole grains. ? Lean meats. ? Low-fat dairy products.  Eat extra salt only as directed. Do not add extra salt to your diet unless your health care provider told you to do that.  Eat frequent, small meals.  Avoid standing up suddenly after eating.   Medicines  Take over-the-counter and prescription medicines only as told by your health care provider. ? Follow instructions from your health care provider about changing the dosage of your current medicines, if this applies. ? Do not stop or adjust any of your medicines on your own. General instructions  Wear compression stockings as told by your health care provider.  Get up slowly from lying down or sitting positions. This gives your blood pressure a chance to adjust.  Avoid hot showers and excessive heat as directed by your health care provider.  Return to your normal activities as told by your health care provider. Ask your health care provider what activities are safe for you.  Do not use any products that contain nicotine or tobacco, such as cigarettes, e-cigarettes,  and chewing tobacco. If you need help quitting, ask your health care provider.  Keep all follow-up visits as told by your health care provider. This is important.   Contact a health care provider if you:  Vomit.  Have diarrhea.  Have a fever for more than 2-3 days.  Feel more thirsty than usual.  Feel weak and tired. Get help right away if you:  Have chest pain.  Have a fast or irregular heartbeat.  Develop numbness in any part of your body.  Cannot move your arms or your legs.  Have trouble speaking.  Become sweaty or feel light-headed.  Faint.  Feel short of breath.  Have trouble staying awake.  Feel confused. Summary  Orthostatic hypotension is a sudden drop in blood pressure that happens when you  quickly change positions.  Orthostatic hypotension is usually not a serious problem.  It is diagnosed by having your blood pressure taken lying down, sitting, and then standing.  It may be treated by changing your diet or adjusting your medicines. This information is not intended to replace advice given to you by your health care provider. Make sure you discuss any questions you have with your health care provider. Document Revised: 04/28/2018 Document Reviewed: 04/28/2018 Elsevier Patient Education  2021 [employer].

## 2021-02-21 DIAGNOSIS — E1121 Type 2 diabetes mellitus with diabetic nephropathy: Secondary | ICD-10-CM | POA: Diagnosis not present

## 2021-02-21 DIAGNOSIS — I129 Hypertensive chronic kidney disease with stage 1 through stage 4 chronic kidney disease, or unspecified chronic kidney disease: Secondary | ICD-10-CM | POA: Diagnosis not present

## 2021-02-21 DIAGNOSIS — M8588 Other specified disorders of bone density and structure, other site: Secondary | ICD-10-CM | POA: Diagnosis not present

## 2021-02-21 DIAGNOSIS — R413 Other amnesia: Secondary | ICD-10-CM | POA: Diagnosis not present

## 2021-02-21 DIAGNOSIS — E785 Hyperlipidemia, unspecified: Secondary | ICD-10-CM | POA: Diagnosis not present

## 2021-02-21 DIAGNOSIS — Z0000 Encounter for general adult medical examination without abnormal findings: Secondary | ICD-10-CM | POA: Diagnosis not present

## 2021-02-21 DIAGNOSIS — Z1159 Encounter for screening for other viral diseases: Secondary | ICD-10-CM | POA: Diagnosis not present

## 2021-02-21 DIAGNOSIS — N183 Chronic kidney disease, stage 3 unspecified: Secondary | ICD-10-CM | POA: Diagnosis not present

## 2021-03-01 NOTE — Progress Notes (Deleted)
Cardiology Office Note:    Date:  03/01/2021   ID:  Elizabeth, Russell 17-Sep-1941, MRN [id number]  PCP:  [name], MD   [facility]  Cardiologist:  No primary care provider on file.  Advanced Practice Provider:  No care team member to display Electrophysiologist:  None   Referring MD: [name], MD     History of Present Illness:    Elizabeth Russell is a 80 y.o. female with a hx of HTN, DMII, GERD, CKD stage 3, and anxiety who presents to clinic for follow-up.   Patient presented to [facility] on 11/18/19 with chest pain that began the night prior after eating. In ED, trop negative x2, ECG with NSR and no ischemic changes, CXR with no acute findings. She was given an antiacid with improvement and referred back to her PCP for further management. At her PCPs office, she was complaining of intermittent episodes of lightheadedness and hypotension for which she was initially referred to Cardiology clinic.  During our visit on 11/26/20, the patient was having episodes of lightheadedness and shortness of breath while standing. Had some associated hypotension during these periods as well. No LOC. We were concerned about orthostatic hypotension and decreased her losartan to 25mg  daily, recommended compression socks, and increased fluid intake.  Today,  Past Medical History:  Diagnosis Date  . Hypertension     No past surgical history on file.  Current Medications: No outpatient medications have been marked as taking for the 03/04/21 encounter (Appointment) with 03/06/21, MD.     Allergies:   Patient has no known allergies.   Social History   Socioeconomic History  . Marital status: Married    Spouse name: Not on file  . Number of children: Not on file  . Years of education: Not on file  . Highest education level: Not on file  Occupational History  . Not on file  Tobacco Use  . Smoking status: Never Smoker  . Smokeless tobacco: Never Used   Vaping Use  . Vaping Use: Never used  Substance and Sexual Activity  . Alcohol use: No  . Drug use: No  . Sexual activity: Not on file  Other Topics Concern  . Not on file  Social History Narrative  . Not on file   Social Determinants of Health   Financial Resource Strain: Not on file  Food Insecurity: Not on file  Transportation Needs: Not on file  Physical Activity: Not on file  Stress: Not on file  Social Connections: Not on file     Family History: The patient's ***family history is not on file.  ROS:   Please see the history of present illness.    *** All other systems reviewed and are negative.  EKGs/Labs/Other Studies Reviewed:    The following studies were reviewed today:   EKG:  EKG is *** ordered today.  The ekg ordered today demonstrates ***  Recent Labs: No results found for requested labs within last 8760 hours.  Recent Lipid Panel No results found for: CHOL, TRIG, HDL, CHOLHDL, VLDL, LDLCALC, LDLDIRECT   Risk Assessment/Calculations:   {Does this patient have ATRIAL FIBRILLATION?:[id number]}   Physical Exam:    VS:  There were no vitals taken for this visit.    Wt Readings from Last 3 Encounters:  11/26/20 156 lb 3.2 oz (70.9 kg)     GEN: *** Well nourished, well developed in no acute distress HEENT: Normal NECK: No JVD; No carotid  bruits LYMPHATICS: No lymphadenopathy CARDIAC: ***RRR, no murmurs, rubs, gallops RESPIRATORY:  Clear to auscultation without rales, wheezing or rhonchi  ABDOMEN: Soft, non-tender, non-distended MUSCULOSKELETAL:  No edema; No deformity  SKIN: Warm and dry NEUROLOGIC:  Alert and oriented x 3 PSYCHIATRIC:  Normal affect   ASSESSMENT:    No diagnosis found. PLAN:    In order of problems listed above:  #Orthostatic Hypotension: Patient presents with intermittent episodes of lightheadedness after prolonged standing with associated hypotension with blood pressures in 80-90s/60s. Symptoms resolve with sitting  down. No associated chest pain, palpitations, nausea/vomiting, diaphoresis. No LOC. She currently is on losartan for HTN with blood pressures mainly 110s at home. Suspect orthostatic hypotension given symptoms with standing and associated hypotension. Will decrease losartan dosing and start compression therapy and monitor for improvement.  -Decreased losartan to 25mg  daily  -Continue compression socks -Continue fluid intake -If symptoms recur, stop losartan all together -Discussed that if she feels faint, she needs to lay down and put her legs up to help with venous return -If continues to have symptoms despite above measures, will plan on zio patch   #HTN: Improved. -Continue losartan 25mg  daily  #HLD: -Continue pravastatin  #DMII: Diet controlled and managed by PCP. -Follow-up with PCP as scheduled  #CKD Stage 3: Stable. -Follow-up with PCP as scheduled   {Are you ordering a CV Procedure (e.g. stress test, cath, DCCV, TEE, etc)?   Press F2        :    Medication Adjustments/Labs and Tests Ordered: Current medicines are reviewed at length with the patient today.  Concerns regarding medicines are outlined above.  No orders of the defined types were placed in this encounter.  No orders of the defined types were placed in this encounter.   There are no Patient Instructions on file for this visit.   Signed, , MD  03/01/2021 9:40 AM    [facility]

## 2021-03-04 ENCOUNTER — Ambulatory Visit: Payer: Medicare Other | Admitting: Cardiology

## 2021-03-04 ENCOUNTER — Other Ambulatory Visit: Payer: Self-pay

## 2021-03-04 ENCOUNTER — Encounter: Payer: Self-pay | Admitting: Cardiology

## 2021-03-04 VITALS — BP 130/70 | HR 60 | Ht 64.0 in | Wt 156.0 lb

## 2021-03-04 DIAGNOSIS — E7800 Pure hypercholesterolemia, unspecified: Secondary | ICD-10-CM

## 2021-03-04 DIAGNOSIS — I951 Orthostatic hypotension: Secondary | ICD-10-CM

## 2021-03-04 DIAGNOSIS — I10 Essential (primary) hypertension: Secondary | ICD-10-CM

## 2021-03-04 NOTE — Patient Instructions (Signed)
Medication Instructions:   Your physician recommends that you continue on your current medications as directed. Please refer to the Current Medication list given to you today.  *If you need a refill on your cardiac medications before your next appointment, please call your pharmacy*    Follow-Up:  AS NEEDED WITH DR. PEMBERTON  

## 2021-03-04 NOTE — Progress Notes (Signed)
Cardiology Office Note:    Date:  03/04/2021   ID:  Oreatha, Fabry [birth year], MRN [id number]  PCP:  [name], MD   [facility]  Cardiologist:  No primary care provider on file.  Advanced Practice Provider:  No care team member to display Electrophysiologist:  None   Referring MD: [name], MD     History of Present Illness:    Elizabeth Russell is a 80 y.o. female with a hx of HTN, DMII, GERD, CKD stage 3, and anxiety who presents to clinic for follow-up.   Patient presented to [facility] on 11/18/19 with chest pain that began the night prior after eating. In ED, trop negative x2, ECG with NSR and no ischemic changes, CXR with no acute findings. She was given an antiacid with improvement and referred back to her PCP for further management. At her PCPs office, she was complaining of intermittent episodes of lightheadedness and hypotension for which she was initially referred to Cardiology clinic.  During our visit on 11/26/20, the patient was having episodes of lightheadedness and shortness of breath while standing. Had some associated hypotension during these periods as well. No LOC. We were concerned about orthostatic hypotension and decreased her losartan to 25mg  daily, recommended compression socks, and increased fluid intake.  Today, the patient states she feels great since decreasing the losartan and wearing compression stockings.  She reports no shortness of breath, chest pain, or syncope. Her blood pressure remains good at home, and she states she is tolerating her medication. She is still active as well.   Past Medical History:  Diagnosis Date  . Hypertension     No past surgical history on file.  Current Medications: Current Meds  Medication Sig  . famotidine (PEPCID) 40 MG tablet Take 40 mg by mouth as needed.  losartan (COZAAR) 25 MG tablet Take 25 mg by mouth daily.  . pravastatin (PRAVACHOL) 80 MG tablet Take 80 mg by mouth daily.      Allergies:   Patient has no known allergies.   Social History   Socioeconomic History  . Marital status: Married    Spouse name: Not on file  . Number of children: Not on file  . Years of education: Not on file  . Highest education level: Not on file  Occupational History  . Not on file  Tobacco Use  . Smoking status: Never Smoker  . Smokeless tobacco: Never Used  Vaping Use  . Vaping Use: Never used  Substance and Sexual Activity  . Alcohol use: No  . Drug use: No  . Sexual activity: Not on file  Other Topics Concern  . Not on file  Social History Narrative  . Not on file   Social Determinants of Health   Financial Resource Strain: Not on file  Food Insecurity: Not on file  Transportation Needs: Not on file  Physical Activity: Not on file  Stress: Not on file  Social Connections: Not on file     Family History: The patient's family history is not on file.  ROS:   Please see the history of present illness.    All other systems reviewed and are negative.  EKGs/Labs/Other Studies Reviewed:    The following studies were reviewed today:   EKG:   03/04/2021: EKG is not ordered today.  Recent Labs: No results found for requested labs within last 8760 hours.  Recent Lipid Panel No results found for: CHOL, TRIG, HDL, CHOLHDL, VLDL, LDLCALC,  LDLDIRECT   Risk Assessment/Calculations:       Physical Exam:    VS:  BP 130/70   Pulse 60   Ht 5\' 4"  (1.626 m)   Wt 156 lb (70.8 kg)   SpO2 98%   BMI 26.78 kg/m     Wt Readings from Last 3 Encounters:  03/04/21 156 lb (70.8 kg)  11/26/20 156 lb 3.2 oz (70.9 kg)     GEN: Well nourished, well developed in no acute distress HEENT: Normal NECK: No JVD; No carotid bruits CARDIAC: RRR, no murmurs, rubs, gallops RESPIRATORY:  Clear to auscultation without rales, wheezing or rhonchi  ABDOMEN: Soft, non-tender, non-distended MUSCULOSKELETAL:  No edema; No deformity  SKIN: Warm and dry NEUROLOGIC:  Alert and  oriented x 3 PSYCHIATRIC:  Normal affect   ASSESSMENT:    1. Orthostatic hypotension   2. Primary hypertension   3. Pure hypercholesterolemia    PLAN:    In order of problems listed above:  #Orthostatic Hypotension: Significantly improved. Patient presented with intermittent episodes of lightheadedness after prolonged standing with associated hypotension with blood pressures in 80-90s/60s. Symptoms resolved with sitting down. No associated chest pain, palpitations, nausea/vomiting, diaphoresis. No LOC. Now significantly improved.  -Continue losartan to 25mg  daily  -Continue compression socks -Continue fluid intake -If symptoms recur, stop losartan all together -Discussed that if she feels faint, she needs to lay down and put her legs up to help with venous return -If continues to have symptoms despite above measures, will plan on zio patch   #HTN: Well controlled -Continue losartan 25mg  daily  #HLD: -Continue pravastatin  #DMII: Diet controlled and managed by PCP. -Follow-up with PCP as scheduled  #CKD Stage 3: Stable. -Follow-up with PCP as scheduled    Medication Adjustments/Labs and Tests Ordered: Current medicines are reviewed at length with the patient today.  Concerns regarding medicines are outlined above.  No orders of the defined types were placed in this encounter.  No orders of the defined types were placed in this encounter.   Patient Instructions  Medication Instructions:   Your physician recommends that you continue on your current medications as directed. Please refer to the Current Medication list given to you today.  *If you need a refill on your cardiac medications before your next appointment, please call your pharmacy*   Follow-Up:  AS NEEDED WITH DR. 03-22-1995       Follow-up as needed.  I,Mathew Stumpf,acting as a for , MD.,have documented all relevant documentation on the behalf of Shari Prows, MD,as directed by  Neurosurgeon, MD while in the presence of [name], MD.  I, [name], MD, have reviewed all documentation for this visit. The documentation on 03/04/21 for the exam, diagnosis, procedures, and orders are all accurate and complete.  Signed, [name], MD  03/04/2021 11:49 AM    [facility]

## 2021-03-05 DIAGNOSIS — Z780 Asymptomatic menopausal state: Secondary | ICD-10-CM | POA: Diagnosis not present

## 2021-08-22 DIAGNOSIS — G4750 Parasomnia, unspecified: Secondary | ICD-10-CM | POA: Diagnosis not present

## 2021-08-22 DIAGNOSIS — L989 Disorder of the skin and subcutaneous tissue, unspecified: Secondary | ICD-10-CM | POA: Diagnosis not present

## 2021-08-22 DIAGNOSIS — I951 Orthostatic hypotension: Secondary | ICD-10-CM | POA: Diagnosis not present

## 2021-08-22 DIAGNOSIS — Z23 Encounter for immunization: Secondary | ICD-10-CM | POA: Diagnosis not present

## 2021-08-22 DIAGNOSIS — E1121 Type 2 diabetes mellitus with diabetic nephropathy: Secondary | ICD-10-CM | POA: Diagnosis not present

## 2021-08-22 DIAGNOSIS — N183 Chronic kidney disease, stage 3 unspecified: Secondary | ICD-10-CM | POA: Diagnosis not present

## 2021-08-22 DIAGNOSIS — I129 Hypertensive chronic kidney disease with stage 1 through stage 4 chronic kidney disease, or unspecified chronic kidney disease: Secondary | ICD-10-CM | POA: Diagnosis not present

## 2021-08-22 DIAGNOSIS — E785 Hyperlipidemia, unspecified: Secondary | ICD-10-CM | POA: Diagnosis not present

## 2021-09-03 DIAGNOSIS — E1121 Type 2 diabetes mellitus with diabetic nephropathy: Secondary | ICD-10-CM | POA: Diagnosis not present

## 2021-09-03 DIAGNOSIS — E785 Hyperlipidemia, unspecified: Secondary | ICD-10-CM | POA: Diagnosis not present

## 2021-09-04 DIAGNOSIS — H04123 Dry eye syndrome of bilateral lacrimal glands: Secondary | ICD-10-CM | POA: Diagnosis not present

## 2021-09-04 DIAGNOSIS — H40013 Open angle with borderline findings, low risk, bilateral: Secondary | ICD-10-CM | POA: Diagnosis not present

## 2021-09-04 DIAGNOSIS — R7309 Other abnormal glucose: Secondary | ICD-10-CM | POA: Diagnosis not present

## 2021-09-04 DIAGNOSIS — H26491 Other secondary cataract, right eye: Secondary | ICD-10-CM | POA: Diagnosis not present

## 2021-09-04 DIAGNOSIS — Z961 Presence of intraocular lens: Secondary | ICD-10-CM | POA: Diagnosis not present

## 2021-09-24 DIAGNOSIS — Z1231 Encounter for screening mammogram for malignant neoplasm of breast: Secondary | ICD-10-CM | POA: Diagnosis not present

## 2021-10-07 ENCOUNTER — Emergency Department (HOSPITAL_COMMUNITY)
Admission: EM | Admit: 2021-10-07 | Discharge: 2021-10-07 | Disposition: A | Discharge status: Home / Self Care | Payer: Medicare Other | Attending: Emergency Medicine | Admitting: Emergency Medicine

## 2021-10-07 ENCOUNTER — Other Ambulatory Visit: Payer: Self-pay

## 2021-10-07 ENCOUNTER — Encounter (HOSPITAL_COMMUNITY): Payer: Self-pay

## 2021-10-07 ENCOUNTER — Emergency Department (HOSPITAL_COMMUNITY): Payer: Medicare Other

## 2021-10-07 DIAGNOSIS — Z79899 Other long term (current) drug therapy: Secondary | ICD-10-CM | POA: Insufficient documentation

## 2021-10-07 DIAGNOSIS — I10 Essential (primary) hypertension: Secondary | ICD-10-CM | POA: Diagnosis not present

## 2021-10-07 DIAGNOSIS — R6889 Other general symptoms and signs: Secondary | ICD-10-CM | POA: Diagnosis not present

## 2021-10-07 DIAGNOSIS — I499 Cardiac arrhythmia, unspecified: Secondary | ICD-10-CM | POA: Diagnosis not present

## 2021-10-07 DIAGNOSIS — Z7901 Long term (current) use of anticoagulants: Secondary | ICD-10-CM | POA: Diagnosis not present

## 2021-10-07 DIAGNOSIS — Z743 Need for continuous supervision: Secondary | ICD-10-CM | POA: Diagnosis not present

## 2021-10-07 DIAGNOSIS — R079 Chest pain, unspecified: Secondary | ICD-10-CM | POA: Diagnosis not present

## 2021-10-07 DIAGNOSIS — I4891 Unspecified atrial fibrillation: Secondary | ICD-10-CM | POA: Diagnosis not present

## 2021-10-07 DIAGNOSIS — R0789 Other chest pain: Secondary | ICD-10-CM | POA: Diagnosis not present

## 2021-10-07 LAB — CBC WITH DIFFERENTIAL/PLATELET
Abs Immature Granulocytes: 0.02 10*3/uL (ref 0.00–0.07)
Basophils Absolute: 0 10*3/uL (ref 0.0–0.1)
Basophils Relative: 0 %
Eosinophils Absolute: 0 10*3/uL (ref 0.0–0.5)
Eosinophils Relative: 1 %
HCT: 39.3 % (ref 36.0–46.0)
Hemoglobin: 13.4 g/dL (ref 12.0–15.0)
Immature Granulocytes: 0 %
Lymphocytes Relative: 27 %
Lymphs Abs: 1.7 10*3/uL (ref 0.7–4.0)
MCH: 28.7 pg (ref 26.0–34.0)
MCHC: 34.1 g/dL (ref 30.0–36.0)
MCV: 84.2 fL (ref 80.0–100.0)
Monocytes Absolute: 0.5 10*3/uL (ref 0.1–1.0)
Monocytes Relative: 8 %
Neutro Abs: 4 10*3/uL (ref 1.7–7.7)
Neutrophils Relative %: 64 %
Platelets: 216 10*3/uL (ref 150–400)
RBC: 4.67 MIL/uL (ref 3.87–5.11)
RDW: 12.7 % (ref 11.5–15.5)
WBC: 6.2 10*3/uL (ref 4.0–10.5)
nRBC: 0 % (ref 0.0–0.2)

## 2021-10-07 LAB — COMPREHENSIVE METABOLIC PANEL
ALT: 16 U/L (ref 0–44)
AST: 23 U/L (ref 15–41)
Albumin: 3.5 g/dL (ref 3.5–5.0)
Alkaline Phosphatase: 58 U/L (ref 38–126)
Anion gap: 6 (ref 5–15)
BUN: 14 mg/dL (ref 8–23)
CO2: 25 mmol/L (ref 22–32)
Calcium: 9.3 mg/dL (ref 8.9–10.3)
Chloride: 107 mmol/L (ref 98–111)
Creatinine, Ser: 1.31 mg/dL — ABNORMAL HIGH (ref 0.44–1.00)
GFR, Estimated: 41 mL/min — ABNORMAL LOW (ref >60)
Glucose, Bld: 152 mg/dL — ABNORMAL HIGH (ref 70–99)
Potassium: 3.7 mmol/L (ref 3.5–5.1)
Sodium: 138 mmol/L (ref 135–145)
Total Bilirubin: 0.6 mg/dL (ref 0.3–1.2)
Total Protein: 5.8 g/dL — ABNORMAL LOW (ref 6.5–8.1)

## 2021-10-07 LAB — TROPONIN I (HIGH SENSITIVITY)
Troponin I (High Sensitivity): 11 ng/L (ref <18)
Troponin I (High Sensitivity): 14 ng/L (ref <18)

## 2021-10-07 LAB — TSH: TSH: 1.529 u[IU]/mL (ref 0.350–4.500)

## 2021-10-07 MED ORDER — METOPROLOL TARTRATE 25 MG PO TABS: 25.0000 mg | ORAL_TABLET | Freq: Two times a day (BID) | ORAL | 0 refills | Status: DC

## 2021-10-07 MED ORDER — APIXABAN 5 MG PO TABS: 5.0000 mg | ORAL_TABLET | Freq: Two times a day (BID) | ORAL | 0 refills | Status: DC

## 2021-10-07 NOTE — ED Triage Notes (Signed)
BIB [facility] from home. Pt started to have center chest pain. NO SHOB, pain does not radiated. Skin dry. EMS arrived Afib showed on monitor. Heart rate 150s-170s. No history of afib. 324 aspirin given and 2 nitro. 10mg  Cardizem IV given. Heart rate down to 90-110.   Initial BP 160/100 after nitro 134/80.   22 L wrist

## 2021-10-07 NOTE — ED Notes (Signed)
Patient verbalizes understanding of discharge instructions. Prescriptions and follow-up care reviewed. Opportunity for questioning and answers were provided. Armband removed by staff, pt discharged from ED ambulatory.  

## 2021-10-07 NOTE — ED Provider Notes (Signed)
[facility] EMERGENCY DEPARTMENT Provider Note  CSN: [id number] Arrival date & time: 10/07/21 1720    History Chief Complaint  Patient presents with   Chest Pain    Elizabeth Russell is a 80 y.o. female with history of well controlled HTN reports about an hour and a half prior to arrival while standing at her kitchen sink doing dishes she began to feel her heart fluttering and had an 'indigestion' like chest pain, not associated with SOB, radiation, nausea or diaphoresis. She called EMS and they found her to be in rapid afib. She was given ASA and Cardizem bolus with improvement in rate. She is feeling better on arrival.    Past Medical History:  Diagnosis Date   Hypertension     No past surgical history on file.  No family history on file.  Social History   Tobacco Use   Smoking status: Never   Smokeless tobacco: Never  Vaping Use   Vaping Use: Never used  Substance Use Topics   Alcohol use: No   Drug use: No     Home Medications Prior to Admission medications   Medication Sig Start Date End Date Taking? Authorizing Provider  acetaminophen (TYLENOL) 500 MG tablet Take 500-1,000 mg by mouth every 6 (six) hours as needed for mild pain or headache.   Yes [provider]  apixaban (ELIQUIS) 5 MG TABS tablet Take 1 tablet (5 mg total) by mouth 2 (two) times daily. 10/07/21 11/06/21 Yes Pollyann Savoy, MD  losartan (COZAAR) 25 MG tablet Take 25 mg by mouth daily.   Yes [provider]  metoprolol tartrate (LOPRESSOR) 25 MG tablet Take 1 tablet (25 mg total) by mouth 2 (two) times daily. 10/07/21  Yes Pollyann Savoy, MD  pravastatin (PRAVACHOL) 80 MG tablet Take 80 mg by mouth daily.   Yes [provider]     Allergies    Patient has no known allergies.   Review of Systems   Review of Systems A comprehensive review of systems was completed and negative except as noted in HPI.    Physical Exam BP 140/85   Pulse (!) 58   Temp 98 F  (36.7 C) (Oral)   Resp 17   Ht 5\' 4"  (1.626 m)   Wt 68.9 kg   SpO2 100%   BMI 26.09 kg/m   Physical Exam Vitals and nursing note reviewed.  Constitutional:      Appearance: Normal appearance.  HENT:     Head: Normocephalic and atraumatic.     Nose: Nose normal.     Mouth/Throat:     Mouth: Mucous membranes are moist.  Eyes:     Extraocular Movements: Extraocular movements intact.     Conjunctiva/sclera: Conjunctivae normal.  Cardiovascular:     Rate and Rhythm: Tachycardia present. Rhythm irregular.  Pulmonary:     Effort: Pulmonary effort is normal.     Breath sounds: Normal breath sounds.  Abdominal:     General: Abdomen is flat.     Palpations: Abdomen is soft.     Tenderness: There is no abdominal tenderness.  Musculoskeletal:        General: No swelling. Normal range of motion.     Cervical back: Neck supple.  Skin:    General: Skin is warm and dry.  Neurological:     General: No focal deficit present.     Mental Status: She is alert.  Psychiatric:        Mood and Affect:  Mood normal.     ED Results / Procedures / Treatments   Labs (all labs ordered are listed, but only abnormal results are displayed) Labs Reviewed  COMPREHENSIVE METABOLIC PANEL - Abnormal; Notable for the following components:      Result Value   Glucose, Bld 152 (*)    Creatinine, Ser 1.31 (*)    Total Protein 5.8 (*)    GFR, Estimated 41 (*)    All other components within normal limits  CBC WITH DIFFERENTIAL/PLATELET  TSH  TROPONIN I (HIGH SENSITIVITY)  TROPONIN I (HIGH SENSITIVITY)    EKG EKG Interpretation  Date/Time:  Tuesday October 07 2021 19:36:52 EST Ventricular Rate:  63 PR Interval:  175 QRS Duration: 83 QT Interval:  372 QTC Calculation: 381 R Axis:   36 Text Interpretation: Sinus rhythm Normal ECG Since last tracing Sinus rhythm has replaced Atrial fibrillation Confirmed by Susy FrizzleSheldon, Danyeal Akens [phone]([id number]) on 10/07/2021 7:41:22 PM  Radiology DG Chest Port 1  View  Result Date: 10/07/2021 CLINICAL DATA:  chest pain, rapid afib EXAM: PORTABLE CHEST 1 VIEW COMPARISON:  11/17/2019. FINDINGS: The heart size and mediastinal contours are within normal limits. Calcific atherosclerosis of the aorta. Both lungs are clear. No visible pleural effusions or pneumothorax. No acute osseous abnormality. IMPRESSION: No evidence of acute cardiopulmonary disease. Electronically Signed   By: Feliberto HartsFrederick S Jones M.D.   On: 10/07/2021 18:09    Procedures Procedures  Medications Ordered in the ED Medications - No data to display   MDM Rules/Calculators/A&P MDM Patient with new onset rapid afib, has had prior palpitations and Cardiology eval but never known to have afib. She has not had any new medication changes, does not drink much caffeine or use any stimulants. She was noted to convert to NSF and back to afib on the monitor during my evaluation. Rate is reasonably well controlled now. Will check labs, CXR and continue to monitor.   ED Course  I have reviewed the triage vital signs and the nursing notes.  Pertinent labs & imaging results that were available during my care of the patient were reviewed by me and considered in my medical decision making (see chart for details).  Clinical Course as of 10/07/21 2115  Tue Oct 07, 2021  1818 CBC is normal. CXR is clear.  [CS]  1934 CMP and Trop are normal. TSH is normal.  [CS]  2000 Repeat EKG has captured NSR.  [CS]  2016 Patient remains in NSR. Asymptomatic now. Will check delta Trop and continue to monitor.  [CS]  2055 Repeat Trop is still neg. Will discuss with Cardiology regarding treatment and dispo.  [CS]  2104 CHADSVASC score is 4 [CS]  2112 Spoke with Dr. Mackie PaiUgowe, Cardiology, who recommends outpatient management, rate control meds and DOAC. Will begin low dose metoprolol (she has had orthostatic symptoms in the past) and Eliquis. Advised to call Cards office for an appointment and RTED for any other concerns.  [CS]     Clinical Course User Index [CS] Pollyann SavoySheldon, Jacalyn Biggs B, MD    Final Clinical Impression(s) / ED Diagnoses Final diagnoses:  New onset a-fib [facility](HCC)    Rx / DC Orders ED Discharge Orders          Ordered    metoprolol tartrate (LOPRESSOR) 25 MG tablet  2 times daily        10/07/21 2115    apixaban (ELIQUIS) 5 MG TABS tablet  2 times daily        10/07/21 2115  Truddie Hidden, MD 10/07/21 2116

## 2021-10-08 ENCOUNTER — Telehealth (HOSPITAL_COMMUNITY): Payer: Self-pay

## 2021-10-08 NOTE — Telephone Encounter (Signed)
Reached out to patient to schedule ED follow-up. Patient stated she want to follow-up with Dr. Shari Prows.

## 2021-10-13 ENCOUNTER — Other Ambulatory Visit: Payer: Self-pay

## 2021-10-13 ENCOUNTER — Ambulatory Visit: Payer: Medicare Other | Attending: Internal Medicine

## 2021-10-13 ENCOUNTER — Other Ambulatory Visit (HOSPITAL_BASED_OUTPATIENT_CLINIC_OR_DEPARTMENT_OTHER): Payer: Self-pay

## 2021-10-13 DIAGNOSIS — Z23 Encounter for immunization: Secondary | ICD-10-CM

## 2021-10-13 MED ORDER — PFIZER COVID-19 VAC BIVALENT 30 MCG/0.3ML IM SUSP
INTRAMUSCULAR | 0 refills | Status: DC
  Filled 2021-10-13: qty 0.3, 1d supply, fill #0

## 2021-10-13 NOTE — Progress Notes (Signed)
   Covid-19 Vaccination Clinic  Name:  Elizabeth Russell    MRN: [id number] DOB: 1941/03/08  10/13/2021  [name] was observed post Covid-19 immunization for 15 minutes without incident. She was provided with Vaccine Information Sheet and instruction to access the V-Safe system.   [name] was instructed to call 911 with any severe reactions post vaccine: Difficulty breathing  Swelling of face and throat  A fast heartbeat  A bad rash all over body  Dizziness and weakness   Immunizations Administered     Name Date Dose VIS Date Route   Pfizer Covid-19 Vaccine Bivalent Booster 10/13/2021 10:43 AM 0.3 mL 07/16/2021 Intramuscular   Manufacturer: [employer], [employer]   Lot: [id number]   NDC: [phone]

## 2021-10-17 ENCOUNTER — Ambulatory Visit (HOSPITAL_BASED_OUTPATIENT_CLINIC_OR_DEPARTMENT_OTHER): Payer: Medicare Other | Admitting: Family

## 2021-10-17 ENCOUNTER — Encounter (HOSPITAL_BASED_OUTPATIENT_CLINIC_OR_DEPARTMENT_OTHER): Payer: Self-pay | Admitting: Family

## 2021-10-17 ENCOUNTER — Other Ambulatory Visit: Payer: Self-pay

## 2021-10-17 VITALS — BP 138/90 | HR 45 | Ht 64.0 in | Wt 154.0 lb

## 2021-10-17 DIAGNOSIS — I4891 Unspecified atrial fibrillation: Secondary | ICD-10-CM

## 2021-10-17 DIAGNOSIS — E782 Mixed hyperlipidemia: Secondary | ICD-10-CM

## 2021-10-17 DIAGNOSIS — D6869 Other thrombophilia: Secondary | ICD-10-CM

## 2021-10-17 DIAGNOSIS — I480 Paroxysmal atrial fibrillation: Secondary | ICD-10-CM

## 2021-10-17 NOTE — Progress Notes (Signed)
Office Visit    Patient Name: Elizabeth Russell Date of Encounter: 10/17/2021  PCP:  Harlan Stains, MD   [place]  Cardiologist:  Freada Bergeron, MD  Advanced Practice Provider:  No care team member to display Electrophysiologist:  None      Chief Complaint    Elizabeth Russell is a 80 y.o. female with a hx of hypertension, DM2, GERD, CKD3, orthostatic hypotension, atrial fibrillation presents today for follow up after ED visit for atrial fib.   Past Medical History    Past Medical History:  Diagnosis Date   Hypertension    No past surgical history on file.  Allergies  No Known Allergies  History of Present Illness    Elizabeth Russell is a 80 y.o. female with a hx of hypertension, DM2, GERD, CKD3, orthostatic hypotension, atrial fibrillation last seen 03/04/21.  Presented 11/2019 with chest painto ED with troponin negative x2, EKG NSR, CXR no acute findings. Symptoms improved with antacid. Referred to cardiology  by PCP. Evaluated by Dr. Johney Frame as new patient 11/2020 with lightheadedness and shortness of breath with standing. Symptoms consistent with orthostatic hypotension and Losartan reduced to 25mg  QD. At follow up 03/04/21 her symptoms were much improved with medication change and compression stockings.  Seen in the ED 10/07/21 with indegestion like pain found to be in atrial fibrillation. She was in intermittnet NSR and atrial fibrillation during ED evaluation. Discharged on Eliquis 5mg  BID, Metoprolol tartrate 25 mg BID.  Presents today for follow up with her husband. When recounting episode that sent her to the ED tells me her atrial fibrillation was painful. No palpitations. No recurrence since discharge. No dyspnea, edema, orthopnea, PND. We reviewed atrial fibrillation pathophysiology, treatment, causes in detail. Stays well hydrated. Notes some urinary frequency which she attributes to her water intake. Follows low salt diet. Stays very active.    EKGs/Labs/Other Studies Reviewed:   The following studies were reviewed today:   EKG:  EKG is  ordered today.  The ekg ordered today demonstrates sinus bradycardia 45 bpm with no acute ST/T wave changes.   Recent Labs: 10/07/2021: ALT 16; BUN 14; Creatinine, Ser 1.31; Hemoglobin 13.4; Platelets 216; Potassium 3.7; Sodium 138; TSH 1.529  Recent Lipid Panel No results found for: CHOL, TRIG, HDL, CHOLHDL, VLDL, LDLCALC, LDLDIRECT  Risk Assessment/Calculations:   CHA2DS2-VASc Score = 5  This indicates a 7.2% annual risk of stroke. The patient's score is based upon: CHF History: 0 HTN History: 1 Diabetes History: 1 Stroke History: 0 Vascular Disease History: 0 Age Score: 2 Gender Score: 1    Home Medications   Current Meds  Medication Sig   acetaminophen (TYLENOL) 500 MG tablet Take 500-1,000 mg by mouth every 6 (six) hours as needed for mild pain or headache.   apixaban (ELIQUIS) 5 MG TABS tablet Take 1 tablet (5 mg total) by mouth 2 (two) times daily.   COVID-19 mRNA bivalent vaccine, Pfizer, (PFIZER COVID-19 VAC BIVALENT) injection Inject into the muscle.   losartan (COZAAR) 25 MG tablet Take 25 mg by mouth daily.   metoprolol tartrate (LOPRESSOR) 25 MG tablet Take 1 tablet (25 mg total) by mouth 2 (two) times daily.   pravastatin (PRAVACHOL) 80 MG tablet Take 80 mg by mouth daily.     Review of Systems      All other systems reviewed and are otherwise negative except as noted above.  Physical Exam    VS:  BP 138/90 (BP Location:  Left Arm, Patient Position: Sitting, Cuff Size: Normal)   Pulse (!) 45   Ht 5\' 4"  (1.626 m)   Wt 154 lb (69.9 kg)   SpO2 97%   BMI 26.43 kg/m  , BMI Body mass index is 26.43 kg/m.  Wt Readings from Last 3 Encounters:  10/17/21 154 lb (69.9 kg)  10/07/21 152 lb (68.9 kg)  03/04/21 156 lb (70.8 kg)     GEN: Well nourished, well developed, in no acute distress. HEENT: normal. Neck: Supple, no JVD, carotid bruits, or  masses. Cardiac: bradycardic, RRR, no murmurs, rubs, or gallops. No clubbing, cyanosis, edema.  Radials/PT 2+ and equal bilaterally.  Respiratory:  Respirations regular and unlabored, clear to auscultation bilaterally. GI: Soft, nontender, nondistended. MS: No deformity or atrophy. Skin: Warm and dry, no rash. Neuro:  Strength and sensation are intact. Psych: Normal affect.  Assessment & Plan    PAF / Hypercoagulable state - Recent diagnosis during ED visit. Symptomatic with chest discomfort. No recurrence. Was discharged on Eliquis 5mg  BID which we will continue and Metoprolol 25mg  BID. Does not meet criteria for reduced dose Eliquis. Will reduce Metoprolol to 12.5mg  BID due to bradycardia 45bpm today by EKG. Long discussion regarding pathophysiology, causes, treatment of atrial fibrillation. She is understandably concerned and wishes to discuss with her primary cardiologist. Wishes to defer refills of medications until follow up in 2 weeks with Dr. 03/06/21. Has enough medication to get her to this appointment. Plan for echocardiogram to establish baseline and rule out valvular abnormality as contributory. No excessive caffeine intake and stays hydrated. Endorses recent stress. 09/2021 normal thyroid function, electrolytes, and CBC with no significant anemia. Will need to inquire about possible sleep apnea symptoms as not addressed at today's clinic visit.   HTN - BP well controlled. Continue current antihypertensive regimen.  No recurrent orthostatic symptoms. Continue compression stockings.   HLD - Continue Pravastatin 80mg  QD. Managed by primary care. No myalgias.   DM2 - Diet controlled and manged by PCP.   CKD 3 - Careful titration of diuretic and antihypertensive.    Disposition: Follow up in 2 week(s) with , MD as scheduled  Signed, Shari Prows, NP 10/17/2021, 10:20 PM [facility]

## 2021-10-17 NOTE — Patient Instructions (Addendum)
Medication Instructions:  Your physician has recommended you make the following change in your medication:   Reduce: Metoprolol to 12.5 mg (half tablet) 2 times daily   *If you need a refill on your cardiac medications before your next appointment, please call your pharmacy*   Lab Work: None ordered today  Testing/Procedures: Your physician has requested that you have an echocardiogram. Echocardiography is a painless test that uses sound waves to create images of your heart. It provides your doctor with information about the size and shape of your heart and how well your heart's chambers and valves are working. This procedure takes approximately one hour. There are no restrictions for this procedure.    Follow-Up: At [facility], you and your health needs are our priority.  As part of our continuing mission to provide you with exceptional heart care, we have created designated Provider Care Teams.  These Care Teams include your primary Cardiologist (physician) and Advanced Practice Providers (APPs -  Physician Assistants and Nurse Practitioners) who all work together to provide you with the care you need, when you need it.  We recommend signing up for the patient portal called "MyChart".  Sign up information is provided on this After Visit Summary.  MyChart is used to connect with patients for Virtual Visits (Telemedicine).  Patients are able to view lab/test results, encounter notes, upcoming appointments, etc.  Non-urgent messages can be sent to your provider as well.   To learn more about what you can do with MyChart, go to [email or web address].    Your next appointment:   As scheduled   The format for your next appointment:   In Person  Provider:  Dr. Johney Frame    Other Instructions You can use the MyChart help line if you need help getting in. MyChart Username: [id number]  Atrial Fibrillation Atrial fibrillation is a type of heartbeat that is irregular or fast. If you  have this condition, your heart beats without any order. This makes it hard for your heart to pump blood in a normal way. Atrial fibrillation may come and go, or it may become a long-lasting problem. If this condition is not treated, it can put you at higher risk for stroke, heart failure, and other heart problems. What are the causes? This condition may be caused by diseases that damage the heart. They include: High blood pressure. Heart failure. Heart valve disease. Heart surgery. Other causes include: Diabetes. Thyroid disease. Being overweight. Kidney disease. Sometimes the cause is not known. What increases the risk? You are more likely to develop this condition if: You are older. You smoke. You exercise often and very hard. You have a family history of this condition. You are a man. You use drugs. You drink a lot of alcohol. You have lung conditions, such as emphysema, pneumonia, or COPD. You have sleep apnea. What are the signs or symptoms? Common symptoms of this condition include: A feeling that your heart is beating very fast. Chest pain or discomfort. Feeling short of breath. Suddenly feeling light-headed or weak. Getting tired easily during activity. Fainting. Sweating. In some cases, there are no symptoms. How is this treated? Treatment for this condition depends on underlying conditions and how you feel when you have atrial fibrillation. They include: Medicines to: Prevent blood clots. Treat heart rate or heart rhythm problems. Using devices, such as a pacemaker, to correct heart rhythm problems. Doing surgery to remove the part of the heart that sends bad signals. Closing an area where clots can  form in the heart (left atrial appendage). In some cases, your doctor will treat other underlying conditions. Follow these instructions at home: Medicines Take over-the-counter and prescription medicines only as told by your doctor. Do not take any new medicines  without first talking to your doctor. If you are taking blood thinners: Talk with your doctor before you take any medicines that have aspirin or NSAIDs, such as ibuprofen, in them. Take your medicine exactly as told by your doctor. Take it at the same time each day. Avoid activities that could hurt or bruise you. Follow instructions about how to prevent falls. Wear a bracelet that says you are taking blood thinners. Or, carry a card that lists what medicines you take. Lifestyle   Do not use any products that have nicotine or tobacco in them. These include cigarettes, e-cigarettes, and chewing tobacco. If you need help quitting, ask your doctor. Eat heart-healthy foods. Talk with your doctor about the right eating plan for you. Exercise regularly as told by your doctor. Do not drink alcohol. Lose weight if you are overweight. Do not use drugs, including cannabis. General instructions If you have a condition that causes breathing to stop for a short period of time (apnea), treat it as told by your doctor. Keep a healthy weight. Do not use diet pills unless your doctor says they are safe for you. Diet pills may make heart problems worse. Keep all follow-up visits as told by your doctor. This is important. Contact a doctor if: You notice a change in the speed, rhythm, or strength of your heartbeat. You are taking a blood-thinning medicine and you get more bruising. You get tired more easily when you move or exercise. You have a sudden change in weight. Get help right away if:  You have pain in your chest or your belly (abdomen). You have trouble breathing. You have side effects of blood thinners, such as blood in your vomit, poop (stool), or pee (urine), or bleeding that cannot stop. You have any signs of a stroke. "BE FAST" is an easy way to remember the main warning signs: B - Balance. Signs are dizziness, sudden trouble walking, or loss of balance. E - Eyes. Signs are trouble seeing or a  change in how you see. F - Face. Signs are sudden weakness or loss of feeling in the face, or the face or eyelid drooping on one side. A - Arms. Signs are weakness or loss of feeling in an arm. This happens suddenly and usually on one side of the body. S - Speech. Signs are sudden trouble speaking, slurred speech, or trouble understanding what people say. T - Time. Time to call emergency services. Write down what time symptoms started. You have other signs of a stroke, such as: A sudden, very bad headache with no known cause. Feeling like you may vomit (nausea). Vomiting. A seizure. These symptoms may be an emergency. Do not wait to see if the symptoms will go away. Get medical help right away. Call your local emergency services (911 in the [place] Do not drive yourself to the hospital. Summary Atrial fibrillation is a type of heartbeat that is irregular or fast. You are at higher risk of this condition if you smoke, are older, have diabetes, or are overweight. Follow your doctor's instructions about medicines, diet, exercise, and follow-up visits. Get help right away if you have signs or symptoms of a stroke. Get help right away if you cannot catch your breath, or you have chest  pain or discomfort. This information is not intended to replace advice given to you by your health care provider. Make sure you discuss any questions you have with your health care provider. Document Revised: 04/26/2019 Document Reviewed: 04/26/2019 Elsevier Patient Education  [place].

## 2021-10-26 NOTE — Progress Notes (Deleted)
Cardiology Office Note:    Date:  10/26/2021   ID:  Pia, Jedlicka 03-19-1941, MRN [id number]  PCP:  [name], MD   [facility]  Cardiologist:  [name], MD  Advanced Practice Provider:  No care team member to display Electrophysiologist:  None   Referring MD: [name], MD     History of Present Illness:    FYNN ADEL is a 80 y.o. female with a hx of HTN, DMII, GERD, CKD stage 3, anxiety, orthostatic hypotension and recent diagnosis of Afib who presents to clinic for follow-up.  Patient presented to [facility] on [facility]/18[facility]19 with chest pain that began the night prior after eating. In ED, trop negative x2, ECG with NSR and no ischemic changes, CXR with no acute findings. She was given an antiacid with improvement and referred back to her PCP for further management. At her PCPs office, she was complaining of intermittent episodes of lightheadedness and hypotension for which she was initially referred to Cardiology clinic.  During our visit on 11/26/20, the patient was having episodes of lightheadedness and shortness of breath while standing. Had some associated hypotension during these periods as well. No LOC. We were concerned about orthostatic hypotension and decreased her losartan to 25mg  daily, recommended compression socks, and increased fluid intake.  Saw in 10/17/21 after going to the ER on 10/07/2021 where she was having indigestion symptoms found to be in intermittent Afib. She was discharged on apixaban and metop 25mg  BID. When she saw [facility] in clinic, she was doing well. No palpitations.  Today, ***   Past Medical History:  Diagnosis Date   Hypertension     No past surgical history on file.  Current Medications: No outpatient medications have been marked as taking for the 10/30/21 encounter (Appointment) with [facility], MD.     Allergies:   Patient has no known allergies.   Social History    Socioeconomic History   Marital status: Married    Spouse name: Not on file   Number of children: Not on file   Years of education: Not on file   Highest education level: Not on file  Occupational History   Not on file  Tobacco Use   Smoking status: Never   Smokeless tobacco: Never  Vaping Use   Vaping Use: Never used  Substance and Sexual Activity   Alcohol use: No   Drug use: No   Sexual activity: Not on file  Other Topics Concern   Not on file  Social History Narrative   Not on file   Social Determinants of Health   Financial Resource Strain: Not on file  Food Insecurity: Not on file  Transportation Needs: Not on file  Physical Activity: Not on file  Stress: Not on file  Social Connections: Not on file     Family History: The patient's family history is not on file.  ROS:   Please see the history of present illness.    All other systems reviewed and are negative.  EKGs/Labs/Other Studies Reviewed:    The following studies were reviewed today:   EKG:   03/04/2021: EKG is not ordered today.  Recent Labs: 10/07/2021: ALT 16; BUN 14; Creatinine, Ser 1.31; Hemoglobin 13.4; Platelets 216; Potassium 3.7; Sodium 138; TSH 1.529  Recent Lipid Panel No results found for: CHOL, TRIG, HDL, CHOLHDL, VLDL, LDLCALC, LDLDIRECT   Risk Assessment/Calculations:       Physical Exam:    VS:  There were no vitals taken for this visit.    Wt Readings from Last 3 Encounters:  10/17/21 154 lb (69.9 kg)  10/07/21 152 lb (68.9 kg)  03/04/21 156 lb (70.8 kg)     GEN: Well nourished, well developed in no acute distress HEENT: Normal NECK: No JVD; No carotid bruits CARDIAC: RRR, no murmurs, rubs, gallops RESPIRATORY:  Clear to auscultation without rales, wheezing or rhonchi  ABDOMEN: Soft, non-tender, non-distended MUSCULOSKELETAL:  No edema; No deformity  SKIN: Warm and dry NEUROLOGIC:  Alert and oriented x 3 PSYCHIATRIC:  Normal affect   ASSESSMENT:    No  diagnosis found.  PLAN:    In order of problems listed above:  #Atrial Fibrillation: Patient with recently diagnosed Afib in 09/2021 when she presented to the ER with indigestion symptoms. Was discharged on apixaban and metoprolol.  -TTE ordered -Continue apixaban 5mg  BID -Continue metoprolol 12.5mg  BID -OSA??  #Orthostatic Hypotension: Significantly improved.  -Continue losartan to 25mg  daily  -Continue compression socks -Continue fluid intake -If symptoms recur, stop losartan all together -Discussed that if she feels faint, she needs to lay down and put her legs up to help with venous return -If continues to have symptoms despite above measures, will plan on zio patch    #HTN: Well controlled -Continue losartan 25mg  daily   #HLD: -Continue pravastatin   #DMII: Diet controlled and managed by PCP. -Follow-up with PCP as scheduled   #CKD Stage 3: Stable. -Follow-up with PCP as scheduled    Medication Adjustments/Labs and Tests Ordered: Current medicines are reviewed at length with the patient today.  Concerns regarding medicines are outlined above.  No orders of the defined types were placed in this encounter.  No orders of the defined types were placed in this encounter.   There are no Patient Instructions on file for this visit.   Follow-up as needed.  Signed, Freada Bergeron, MD  10/26/2021 5:06 PM    [place]

## 2021-10-27 ENCOUNTER — Ambulatory Visit (HOSPITAL_BASED_OUTPATIENT_CLINIC_OR_DEPARTMENT_OTHER): Payer: Medicare Other | Admitting: Family

## 2021-10-30 ENCOUNTER — Other Ambulatory Visit: Payer: Self-pay

## 2021-10-30 ENCOUNTER — Ambulatory Visit: Payer: Medicare Other | Admitting: Cardiology

## 2021-10-30 ENCOUNTER — Encounter: Payer: Self-pay | Admitting: Cardiology

## 2021-10-30 VITALS — BP 120/74 | HR 56 | Ht 64.0 in | Wt 150.6 lb

## 2021-10-30 DIAGNOSIS — I951 Orthostatic hypotension: Secondary | ICD-10-CM

## 2021-10-30 DIAGNOSIS — D6869 Other thrombophilia: Secondary | ICD-10-CM | POA: Diagnosis not present

## 2021-10-30 DIAGNOSIS — R42 Dizziness and giddiness: Secondary | ICD-10-CM

## 2021-10-30 DIAGNOSIS — I10 Essential (primary) hypertension: Secondary | ICD-10-CM

## 2021-10-30 DIAGNOSIS — E7800 Pure hypercholesterolemia, unspecified: Secondary | ICD-10-CM | POA: Diagnosis not present

## 2021-10-30 DIAGNOSIS — I4891 Unspecified atrial fibrillation: Secondary | ICD-10-CM

## 2021-10-30 DIAGNOSIS — I480 Paroxysmal atrial fibrillation: Secondary | ICD-10-CM | POA: Diagnosis not present

## 2021-10-30 DIAGNOSIS — E782 Mixed hyperlipidemia: Secondary | ICD-10-CM

## 2021-10-30 MED ORDER — METOPROLOL TARTRATE 25 MG PO TABS: 12.5000 mg | ORAL_TABLET | Freq: Two times a day (BID) | ORAL | 1 refills | Status: DC

## 2021-10-30 MED ORDER — PRAVASTATIN SODIUM 80 MG PO TABS: 80.0000 mg | ORAL_TABLET | Freq: Every day | ORAL | 2 refills | Status: DC

## 2021-10-30 MED ORDER — LOSARTAN POTASSIUM 25 MG PO TABS: 25.0000 mg | ORAL_TABLET | Freq: Every day | ORAL | 2 refills | Status: DC

## 2021-10-30 NOTE — Patient Instructions (Signed)
Medication Instructions:   START TAKING METOPROLOL TARTRATE 12.5 MG BY MOUTH TWICE DAILY AND YOU MAY TAKE AN ADDITIONAL 1/2 DOSE IF HEART RATE GREATER THAN 100 BPM.   *If you need a refill on your cardiac medications before your next appointment, please call your pharmacy*   Follow-Up:  3 MONTHS IN THE OFFICE WITH DR. Oneida Arenas NO AVAILABILITY THEN WITH SCOTT WEAVER PA-C

## 2021-10-30 NOTE — Progress Notes (Signed)
Elizabeth Sprague, MD  Physician Cardiology Progress Notes    Sign when Signing Visit Creation Time:  10/30/2021    Sign when Signing Visit      Expand All Collapse All  Cardiology Office Note:     Date:  10/30/2021    ID:  Elizabeth Russell 06/15/1941, MRN [id number]   PCP:  Elizabeth Montana, MD              Elizabeth Russell  Cardiologist:  Elizabeth Sprague, MD  Advanced Practice Provider:  No care team member to display Electrophysiologist:  None    Referring MD: Elizabeth Montana, MD        History of Present Illness:     Elizabeth Russell is a 80 y.o. female with a hx of HTN, DMII, GERD, CKD stage 3, anxiety, orthostatic hypotension and recent diagnosis of Afib who presents to clinic for follow-up.   Patient presented to [facility] on 11/18/19 with chest pain that began the night prior after eating. In ED, trop negative x2, ECG with NSR and no ischemic changes, CXR with no acute findings. She was given an antiacid with improvement and referred back to her PCP for further management. At her PCPs office, she was complaining of intermittent episodes of lightheadedness and hypotension for which she was initially referred to Cardiology clinic.   During our visit on 11/26/20, the patient was having episodes of lightheadedness and shortness of breath while standing. Had some associated hypotension during these periods as well. No LOC. We were concerned about orthostatic hypotension and decreased her losartan to 25mg  daily, recommended compression socks, and increased fluid intake.   Saw in 10/17/21 after going to the ER on 10/07/2021 where she was having indigestion symptoms found to be in intermittent Afib. She was discharged on apixaban and metop 25mg  BID. When she saw [facility] in clinic, she was doing well. No palpitations.    Today, she is accompanied with her husband, and says that she is well. No further episodes of Afib that she knows of. Has been  tolerating her medications without issues. No bleeding on apixaban. Denies chest pain, SOB, lightheadedness or dizziness. States she can get this feeling of nervousness before she does something she is anxious about but her symptoms resolve once that activity is completed.   She denies any shortness of breath. No lightheadedness, headaches, syncope, orthopnea, PND, lower extremity edema or exertional symptoms.       Past Medical History:  Diagnosis Date   Hypertension        No past surgical history on file.   Current Medications: Active Medications  No outpatient medications have been marked as taking for the 10/30/21 encounter (Appointment) with [facility], MD.        Allergies:   Patient has no known allergies.    Social History         Socioeconomic History   Marital status: Married      Spouse name: Not on file   Number of children: Not on file   Years of education: Not on file   Highest education level: Not on file  Occupational History   Not on file  Tobacco Use   Smoking status: Never   Smokeless tobacco: Never  Vaping Use   Vaping Use: Never used  Substance and Sexual Activity   Alcohol use: No   Drug use: No   Sexual activity: Not on file  Other Topics Concern  Not on file  Social History Narrative   Not on file    Social Determinants of Health    Financial Resource Strain: Not on file  Food Insecurity: Not on file  Transportation Needs: Not on file  Physical Activity: Not on file  Stress: Not on file  Social Connections: Not on file      Family History: The patient's family history is not on file.   ROS:   Please see the history of present illness.    (+) Palpitations (+) Chest Pain (+) Frequent Urination All other systems reviewed and are negative.   EKGs/Labs/Other Studies Reviewed:     The following studies were reviewed today:    EKG:    10/30/21: No new tracing today   Recent Labs: 10/07/2021: ALT 16; BUN 14;  Creatinine, Ser 1.31; Hemoglobin 13.4; Platelets 216; Potassium 3.7; Sodium 138; TSH 1.529  Recent Lipid Panel Labs (Brief)  No results found for: CHOL, TRIG, HDL, CHOLHDL, VLDL, LDLCALC, LDLDIRECT       Risk Assessment/Calculations:         Physical Exam:     VS:  There were no vitals taken for this visit.        Wt Readings from Last 3 Encounters:  10/17/21 154 lb (69.9 kg)  10/07/21 152 lb (68.9 kg)  03/04/21 156 lb (70.8 kg)      GEN: Well nourished, well developed in no acute distress HEENT: Normal NECK: No JVD; No carotid bruits CARDIAC: RRR, no murmurs, rubs, gallops RESPIRATORY:  CTAB, no wheezes  ABDOMEN: Soft, non-tender, non-distended MUSCULOSKELETAL:  No edema; No deformity  SKIN: Warm and dry NEUROLOGIC:  Alert and oriented x 3 PSYCHIATRIC:  Normal affect    ASSESSMENT:     Paroxysmal Afib Orthostatic Hypotension Hypertension Hyperlipidemia Diabetes Mellitus Type II   PLAN:     In order of problems listed above:   #Atrial Fibrillation: CHADs-vasc 5. Patient with recently diagnosed Afib in 09/2021 when she presented to the ER with indigestion symptoms. Was discharged on apixaban and metoprolol. Doing well without recurrence of symptoms -TTE ordered -Continue apixaban 5mg  BID -Continue metoprolol 12.5mg  BID with additional dose as needed for palpitations -Planned for sleep study in 11/2021 to evaluate for OSA   #Orthostatic Hypotension: Significantly improved.  -Continue losartan to 25mg  daily  -Continue compression socks -Continue fluid intake -If symptoms recur, stop losartan all together -Discussed that if she feels faint, she needs to lay down and put her legs up to help with venous return -If continues to have symptoms despite above measures, will plan on zio patch    #HTN: Well controlled -Continue losartan 25mg  daily -Continue metop 12.5mg  BID   #HLD: -Continue pravastatin 80mg  daily   #DMII: Diet controlled and managed by  PCP. -Follow-up with PCP as scheduled   #CKD Stage 3: Stable. -Follow-up with PCP as scheduled    F/U 6 in months   Medication Adjustments/Labs and Tests Ordered: Current medicines are reviewed at length with the patient today.  Concerns regarding medicines are outlined above.  No orders of the defined types were placed in this encounter.   No orders of the defined types were placed in this encounter.     There are no Patient Instructions on file for this visit.    Follow-up in 6 months   I,Tinashe Williams,acting as a Education administrator for Freada Bergeron, MD.,have documented all relevant documentation on the behalf of Freada Bergeron, MD,as directed by  Freada Bergeron, MD  while in the presence of Freada Bergeron, MD.   I, Freada Bergeron, MD, have reviewed all documentation for this visit. The documentation on 10/30/21 for the exam, diagnosis, procedures, and orders are all accurate and complete.     Signed, Freada Bergeron, MD  10/26/2021 5:06 PM    [place]

## 2021-10-31 ENCOUNTER — Encounter (HOSPITAL_BASED_OUTPATIENT_CLINIC_OR_DEPARTMENT_OTHER): Payer: Self-pay

## 2021-10-31 ENCOUNTER — Ambulatory Visit (INDEPENDENT_AMBULATORY_CARE_PROVIDER_SITE_OTHER): Payer: Medicare Other

## 2021-10-31 DIAGNOSIS — I480 Paroxysmal atrial fibrillation: Secondary | ICD-10-CM

## 2021-10-31 LAB — ECHOCARDIOGRAM COMPLETE
AR max vel: 2.07 cm2
AV Area VTI: 2.15 cm2
AV Area mean vel: 2.05 cm2
AV Mean grad: 4 mmHg
AV Peak grad: 7.7 mmHg
AV Vena cont: 0.13 cm
Ao pk vel: 1.39 m/s
Area-P 1/2: 3.74 cm2
Calc EF: 62.6 %
S' Lateral: 2.29 cm
Single Plane A2C EF: 67.5 %
Single Plane A4C EF: 59.7 %

## 2021-11-03 ENCOUNTER — Encounter (HOSPITAL_BASED_OUTPATIENT_CLINIC_OR_DEPARTMENT_OTHER): Payer: Self-pay

## 2021-11-05 ENCOUNTER — Telehealth: Payer: Self-pay

## 2021-11-05 NOTE — Telephone Encounter (Signed)
Called patient per request and dicussed echocardiogram results with her. Pt. Requested that results be mailed to her.   Discussed with patient that I would mail her results when I returned to the office tomorrow 12/22!

## 2021-11-05 NOTE — Telephone Encounter (Signed)
**Note De-Identified Elizabeth Russell Obfuscation** [name], LPN  [name], [name], LPN; [name], CMA Dr. Shari Prows saw this pt in clinic today for hospital follow-up of afib and new start eliquis while in hospital.  Her eliquis is going to cost her $150 a month and she is asking if you could assist her with get pt assistance or a PA for this med.  We did give her samples while she was here.  I told her you would work on this and she is aware you are out on vacation and will address when you return.   I called the pt to discuss the cost of her Eliquis as requested and she stated that for now she can afford her Eliquis but will call us back if the cost becomes a hardship for her.  She is requesting the results of her Echo that was done on 12/16. I advised her that a [facility] message with her Echo results was sent to her on 12/16. She states that she cannot use MYCHART and is requesting verbal results and a copy of her results to be mailed to her.  Forwarding message to K. Andrey Campanile, RN at our [facility] office to advise the pt.

## 2021-11-05 NOTE — Telephone Encounter (Deleted)
**Note De-Identified Elizabeth Russell Obfuscation** Elizabeth Socks, LPN  Elizabeth Russell, Elizabeth Formosa, LPN; Elizabeth Russell, CMA Dr. Shari Prows saw this pt in clinic today for hospital follow-up of afib and new start eliquis while in hospital.  Her eliquis is going to cost her $150 a month and she is asking if you could assist her with get pt assistance or a PA for this med.  We did give her samples while she was here.  I told her you would work on this and she is aware you are out on vacation and will address when you return.    Thanks,  [employer]

## 2021-11-19 ENCOUNTER — Other Ambulatory Visit: Payer: Self-pay

## 2021-11-19 MED ORDER — APIXABAN 5 MG PO TABS: 5.0000 mg | ORAL_TABLET | Freq: Two times a day (BID) | ORAL | 0 refills | Status: DC

## 2021-11-19 NOTE — Telephone Encounter (Signed)
Prescription refill request for Eliquis received. Indication: Afib  Last office visit: 10/30/21 Shari Prows) Scr: 1.31 (10/07/21)  Age: 81 Weight: 68.3kg  Appropriate dose and refill sent to requested pharmacy.

## 2021-12-03 ENCOUNTER — Encounter: Payer: Self-pay | Admitting: Neurology

## 2021-12-03 ENCOUNTER — Ambulatory Visit: Payer: Medicare Other | Admitting: Neurology

## 2021-12-03 VITALS — BP 112/84 | HR 53 | Ht 64.0 in | Wt 151.5 lb

## 2021-12-03 DIAGNOSIS — I480 Paroxysmal atrial fibrillation: Secondary | ICD-10-CM | POA: Diagnosis not present

## 2021-12-03 DIAGNOSIS — G4752 REM sleep behavior disorder: Secondary | ICD-10-CM

## 2021-12-03 MED ORDER — TRAZODONE HCL 50 MG PO TABS: 25.0000 mg | ORAL_TABLET | Freq: Every day | ORAL | 1 refills | Status: DC

## 2021-12-03 MED ORDER — MELATONIN 3 MG PO TABS: 3.0000 mg | ORAL_TABLET | Freq: Every day | ORAL | 0 refills | Status: DC
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
## 2021-12-03 NOTE — Patient Instructions (Signed)
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  3) Atrial fibrillation is newly diagnosed and warrants screening for OSA.    My Plan is to proceed with:  1)attended sleep study - heart rhythm, apnea, hypoxia and PLMs in REM to be recorded.  2) Melatonin 5 mg or less at bedtime. OTC.- If melatonin is not effective alone try Trazodone or Klonipin.  3) Trouble to remember names, but not forgetful in the sense. No trouble with addresses, phone numbers, , birthdays, and appointment.   I would like to thank  [name], Md [address] [place],  [place] [address] , Laurance Flatten, MD, cardiology -[facility],  for allowing me to meet with and to take care of this pleasant patient.   In short, INARI SHIN is presenting with DM, REM BD, atrial fibrillation, mild CKD1, and increasing nocturia fatigue and frequency of parasomnia.   I plan to follow up either personally or through our NP within 2-4  month.

## 2021-12-10 ENCOUNTER — Telehealth: Payer: Self-pay

## 2021-12-10 NOTE — Telephone Encounter (Signed)
Pt canceled her NPSG study due to her not feeling comfortable, Pt was assured she would be safe in our office but she still wishes to cancel. Pt was advised to call back if she changed her mind

## 2022-01-05 ENCOUNTER — Other Ambulatory Visit: Payer: Self-pay

## 2022-01-05 MED ORDER — APIXABAN 5 MG PO TABS: 5.0000 mg | ORAL_TABLET | Freq: Two times a day (BID) | ORAL | 9 refills | Status: DC

## 2022-01-05 NOTE — Telephone Encounter (Signed)
Pt last saw Dr Johney Frame 10/30/21, last labs 10/07/21 Creat 1.31, age 81, weight 68.7kg, based on specified criteria pt is on appropriate dosage of Eliquis 5mg  BID for afib.  Will refill rx.

## 2022-02-04 ENCOUNTER — Ambulatory Visit: Payer: Medicare Other | Admitting: Cardiology

## 2022-02-08 NOTE — Progress Notes (Signed)
?Cardiology Office Note:   ? ?Date:  02/17/2022  ? ?ID:  Elizabeth Russell, DOB 1941/02/10, MRN [id number] ? ?PCP:  Harlan Stains, MD ?  ?[place]  ?Cardiologist:  Freada Bergeron, MD  ?Advanced Practice Provider:  No care team member to display ?Electrophysiologist:  None  ? ?Referring MD: Harlan Stains, MD  ? ? ? ?History of Present Illness:   ? ?Elizabeth Russell is a 81 y.o. female with a hx of HTN, DMII, GERD, CKD stage 3, and anxiety who presents to clinic for follow-up.  ? ?Patient presented to [facility] on 11/18/19 with chest pain that began the night prior after eating. In ED, trop negative x2, ECG with NSR and no ischemic changes, CXR with no acute findings. She was given an antiacid with improvement and referred back to her PCP for further management. At her PCPs office, she was complaining of intermittent episodes of lightheadedness and hypotension for which she was initially referred to Cardiology clinic. ? ?During our visit on 11/26/20, the patient was having episodes of lightheadedness and shortness of breath while standing. Had some associated hypotension during these periods as well. No LOC. We were concerned about orthostatic hypotension and decreased her losartan to 25mg  daily, recommended compression socks, and increased fluid intake. ? ?Saw [name] in 10/17/21 after going to the ER on 10/07/2021 where she was having indigestion symptoms found to be in intermittent Afib. She was discharged on apixaban and metop 25mg  BID. When she saw [facility] in clinic, she was doing well. No palpitations. ? ?Last seen in clinic in 10/2021 where she was doing well with resolution of symptoms since decreasing the losartan and wearing compression socks. No palpitations. Tolerating AC without issues. TTE 10/2021 with LVEF 65-70%, G2DD, mild MR, mildly dilated aorta 28mm.  ? ?Today, the patient states that she had an episode of flutters in her chest and her Kardia device showed possible atrial  fibrillation at that time. Symptoms lasted about an hour before resolving without intervention. Otherwise, she is doing well with no chest pain, SOB, orthopnea, LE edema, lightheadedness, dizziness or syncope. Compliant with medications without missing doses. No bleeding issues on her apixaban. She is concerned that her memory is getting worse and is wondering if this is related to her medications. ? ? ?Past Medical History:  ?Diagnosis Date  ? Afib ([place])   ? Hypertension   ? ? ?No past surgical history on file. ? ?Current Medications: ?Current Meds  ?Medication Sig  ? acetaminophen (TYLENOL) 500 MG tablet Take 500-1,000 mg by mouth every 6 (six) hours as needed for mild pain or headache.  ? COVID-19 mRNA bivalent vaccine, Pfizer, (PFIZER COVID-19 VAC BIVALENT) injection Inject into the muscle.  ? losartan (COZAAR) 25 MG tablet Take 1 tablet (25 mg total) by mouth daily.  ? melatonin 3 MG TABS tablet Take 1 tablet (3 mg total) by mouth at bedtime.  ? metoprolol tartrate (LOPRESSOR) 25 MG tablet Take 0.5 tablets (12.5 mg total) by mouth 2 (two) times daily. May take an extra 1/2 dose as needed if HR>100 bpm.  ? pravastatin (PRAVACHOL) 80 MG tablet Take 1 tablet (80 mg total) by mouth daily.  ?  ? ?Allergies:   Patient has no known allergies.  ? ?Social History  ? ?Socioeconomic History  ? Marital status: Married  ?  Spouse name: Jeneen Rinks  ? Number of children: 3  ? Years of education: Not on file  ? Highest education level: High school  graduate  ?Occupational History  ? Not on file  ?Tobacco Use  ? Smoking status: Never  ? Smokeless tobacco: Never  ?Vaping Use  ? Vaping Use: Never used  ?Substance and Sexual Activity  ? Alcohol use: No  ? Drug use: No  ? Sexual activity: Not on file  ?Other Topics Concern  ? Not on file  ?Social History Narrative  ? Lives at home with her husband  ? Right handed  ? Caffeine: 1 c coffee a day   ? ?Social Determinants of Health  ? ?Financial Resource Strain: Not on file  ?Food Insecurity:  Not on file  ?Transportation Needs: Not on file  ?Physical Activity: Not on file  ?Stress: Not on file  ?Social Connections: Not on file  ?  ? ?Family History: ?The patient's family history includes Heart Problems in her father; High blood pressure in her mother. ? ?ROS:   ?Please see the history of present illness.    ?All other systems reviewed and are negative. ? ?EKGs/Labs/Other Studies Reviewed:   ? ?The following studies were reviewed today: ?TTE 10/2021: ?IMPRESSIONS  ? 1. Left ventricular ejection fraction, by estimation, is 65 to 70%. Left  ?ventricular ejection fraction by 3D volume is 70 %. The left ventricle has  ?normal function. The left ventricle has no regional wall motion  ?abnormalities. There is moderate  ?asymmetric left ventricular hypertrophy of the basal-septal segment. Left  ?ventricular diastolic parameters are consistent with Grade II diastolic  ?dysfunction (pseudonormalization).  ? 2. Right ventricular systolic function is normal. The right ventricular  ?size is normal. There is normal pulmonary artery systolic pressure. The  ?estimated right ventricular systolic pressure is 0000000 mmHg.  ? 3. Left atrial size was mildly dilated.  ? 4. The mitral valve is normal in structure. Mild mitral valve  ?regurgitation.  ? 5. The aortic valve has an indeterminant number of cusps. There is mild  ?thickening of the aortic valve. Aortic valve regurgitation is trivial.  ?Aortic valve sclerosis is present, with no evidence of aortic valve  ?stenosis.  ? 6. Aortic dilatation noted. There is mild dilatation of the ascending  ?aorta, measuring 39 mm.  ? 7. The inferior vena cava is normal in size with greater than 50%  ?respiratory variability, suggesting right atrial pressure of 3 mmHg.  ? ?EKG:   ?No new tracing today ? ?Recent Labs: ?10/07/2021: ALT 16; BUN 14; Creatinine, Ser 1.31; Hemoglobin 13.4; Platelets 216; Potassium 3.7; Sodium 138; TSH 1.529  ?Recent Lipid Panel ?No results found for: CHOL, TRIG,  HDL, CHOLHDL, VLDL, LDLCALC, LDLDIRECT ? ? ?Risk Assessment/Calculations:   ?  ? ? ?Physical Exam:   ? ?VS:  BP 126/72   Pulse (!) 52   Ht 5\' 4"  (1.626 m)   Wt 153 lb 9.6 oz (69.7 kg)   SpO2 99%   BMI 26.37 kg/m?    ? ?Wt Readings from Last 3 Encounters:  ?02/17/22 153 lb 9.6 oz (69.7 kg)  ?12/03/21 151 lb 8 oz (68.7 kg)  ?10/30/21 150 lb 9.6 oz (68.3 kg)  ?  ? ?GEN: Well nourished, well developed in no acute distress, comfortable ?HEENT: Normal ?NECK: No JVD; No carotid bruits ?CARDIAC: RRR, no murmurs, rubs, gallops ?RESPIRATORY:  CTAB, no wheezes ?ABDOMEN: Soft, non-tender, non-distended ?MUSCULOSKELETAL:  No edema; No deformity  ?SKIN: Warm and dry ?NEUROLOGIC:  Alert and oriented x 3 ?PSYCHIATRIC:  Normal affect  ? ?ASSESSMENT:   ? ?1. Paroxysmal atrial fibrillation (HCC)   ?2. Hypercoagulable  state due to paroxysmal atrial fibrillation ([place])   ?3. Mixed hyperlipidemia   ?4. Orthostatic hypotension   ?5. Primary hypertension   ? ? ?PLAN:   ? ?In order of problems listed above: ? ?#Paroxysmal Atrial Fibrillation: ?CHADs-vasc 5. Diagnosed in 09/2021 when she presented to the ER with indigestion symptoms. Was discharged on apixaban and metoprolol. Doing well with brief recurrence of symptoms about 1 week ago that resolved spontaneously. ?-Continue apixaban 5mg  BID ?-Continue metoprolol 12.5mg  BID with additional dose as needed for palpitations ?-TTE with normal BiV function ?  ?#Orthostatic Hypotension: ?Significantly improved.  ?-Continue losartan to 25mg  daily  ?-Continue compression socks ?-Continue fluid intake ?-If symptoms recur, stop losartan all together ?-Discussed that if she feels faint, she needs to lay down and put her legs up to help with venous return ?-If continues to have symptoms despite above measures, will plan on zio patch  ?  ?#HTN: ?Well controlled ?-Continue losartan 25mg  daily ?-Continue metop 12.5mg  BID ?  ?#HLD: ?-Continue pravastatin 80mg  daily ?  ?#DMII: ?Diet controlled and  managed by PCP. ?-Follow-up with PCP as scheduled ?  ?#CKD Stage 3: ?Stable. ?-Follow-up with PCP as scheduled ? ? ? ?Medication Adjustments/Labs and Tests Ordered: ?Current medicines are reviewed at length with the

## 2022-02-17 ENCOUNTER — Encounter: Payer: Self-pay | Admitting: Cardiology

## 2022-02-17 ENCOUNTER — Ambulatory Visit: Payer: Medicare Other | Admitting: Cardiology

## 2022-02-17 VITALS — BP 126/72 | HR 52 | Ht 64.0 in | Wt 153.6 lb

## 2022-02-17 DIAGNOSIS — E782 Mixed hyperlipidemia: Secondary | ICD-10-CM

## 2022-02-17 DIAGNOSIS — I480 Paroxysmal atrial fibrillation: Secondary | ICD-10-CM | POA: Diagnosis not present

## 2022-02-17 DIAGNOSIS — D6869 Other thrombophilia: Secondary | ICD-10-CM | POA: Diagnosis not present

## 2022-02-17 DIAGNOSIS — I10 Essential (primary) hypertension: Secondary | ICD-10-CM | POA: Diagnosis not present

## 2022-02-17 DIAGNOSIS — I951 Orthostatic hypotension: Secondary | ICD-10-CM

## 2022-02-17 NOTE — Patient Instructions (Signed)
Medication Instructions:  ?Your physician recommends that you continue on your current medications as directed. Please refer to the Current Medication list given to you today. ? ?*If you need a refill on your cardiac medications before your next appointment, please call your pharmacy* ? ? ?Lab Work: ?NONE ?If you have labs (blood work) drawn today and your tests are completely normal, you will receive your results only by: ?MyChart Message (if you have MyChart) OR ?A paper copy in the mail ?If you have any lab test that is abnormal or we need to change your treatment, we will call you to review the results. ? ? ?Testing/Procedures: ?NONE ? ? ?Follow-Up: ?At [facility], you and your health needs are our priority.  As part of our continuing mission to provide you with exceptional heart care, we have created designated Provider Care Teams.  These Care Teams include your primary Cardiologist (physician) and Advanced Practice Providers (APPs -  Physician Assistants and Nurse Practitioners) who all work together to provide you with the care you need, when you need it. ? ?Your next appointment:   ?6 month(s) ? ?The format for your next appointment:   ?In Person ? ?Provider:   ?[name], MD   ? ?

## 2022-03-06 IMAGING — DX DG CHEST 1V PORT
1 series · 1 of 1 positions shown · non-contrast
Comparison: 11/17/2019.

CLINICAL DATA: chest pain, rapid afib

EXAM:
PORTABLE CHEST 1 VIEW

[chest ap]
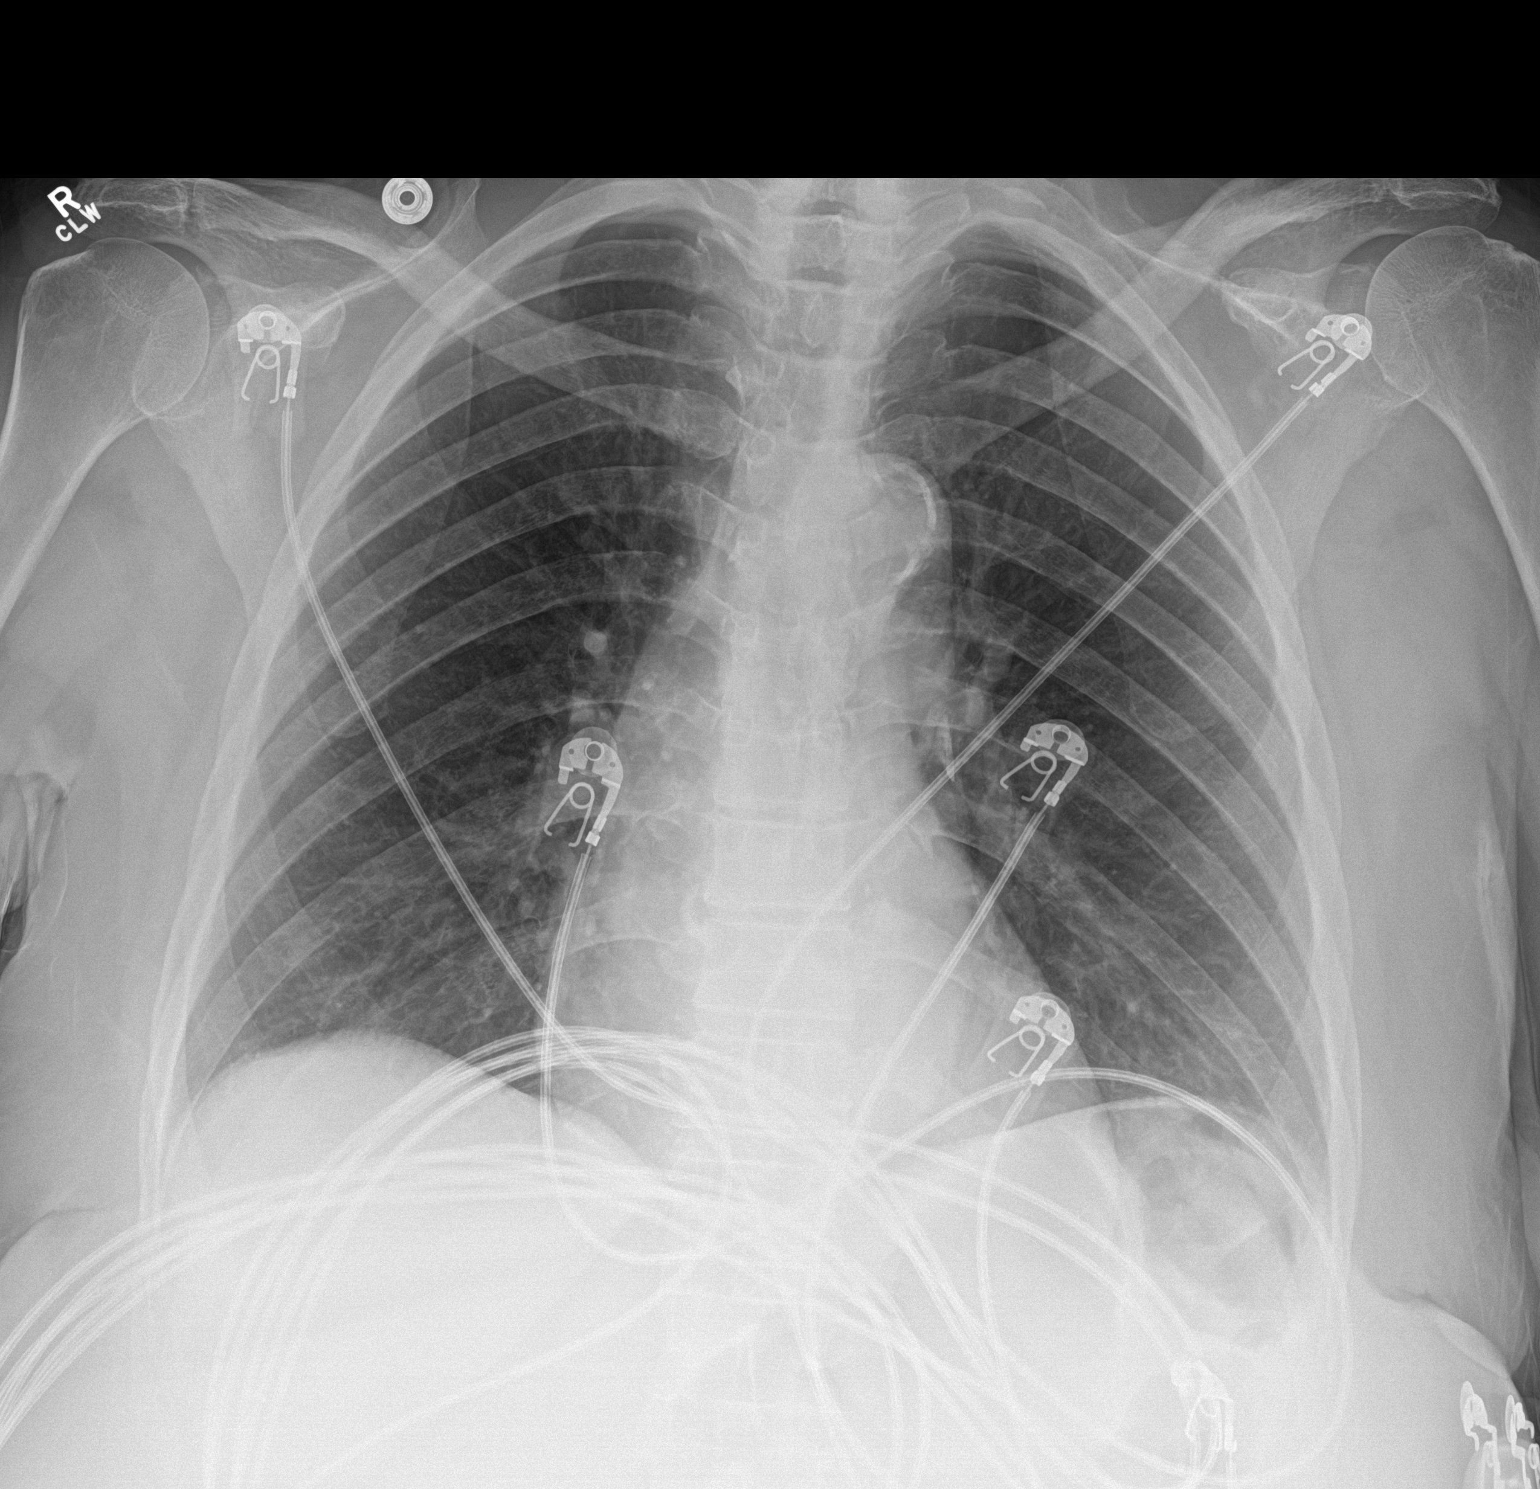

[1 of 1 positions shown; findings below may reference images not displayed]

FINDINGS: The heart size and mediastinal contours are within normal limits.
Calcific atherosclerosis of the aorta. Both lungs are clear. No
visible pleural effusions or pneumothorax. No acute osseous
abnormality.
IMPRESSION: No evidence of acute cardiopulmonary disease.

## 2022-03-30 DIAGNOSIS — I480 Paroxysmal atrial fibrillation: Secondary | ICD-10-CM | POA: Diagnosis not present

## 2022-03-30 DIAGNOSIS — Z0000 Encounter for general adult medical examination without abnormal findings: Secondary | ICD-10-CM | POA: Diagnosis not present

## 2022-03-30 DIAGNOSIS — N183 Chronic kidney disease, stage 3 unspecified: Secondary | ICD-10-CM | POA: Diagnosis not present

## 2022-03-30 DIAGNOSIS — G4752 REM sleep behavior disorder: Secondary | ICD-10-CM | POA: Diagnosis not present

## 2022-03-30 DIAGNOSIS — E785 Hyperlipidemia, unspecified: Secondary | ICD-10-CM | POA: Diagnosis not present

## 2022-03-30 DIAGNOSIS — I129 Hypertensive chronic kidney disease with stage 1 through stage 4 chronic kidney disease, or unspecified chronic kidney disease: Secondary | ICD-10-CM | POA: Diagnosis not present

## 2022-03-30 DIAGNOSIS — E1121 Type 2 diabetes mellitus with diabetic nephropathy: Secondary | ICD-10-CM | POA: Diagnosis not present

## 2022-04-29 ENCOUNTER — Telehealth: Payer: Self-pay | Admitting: Neurology

## 2022-04-29 NOTE — Telephone Encounter (Signed)
[employer] medicare no auth req. Patient called wanting to reschedule from January.   Patient is scheduled for Tuesday 05/05/22 at 8 pm.

## 2022-05-04 NOTE — Telephone Encounter (Signed)
pt canceled her 05/05/22 appt bc she wants to speak to her Dr. first EE

## 2022-06-23 DIAGNOSIS — R42 Dizziness and giddiness: Secondary | ICD-10-CM | POA: Diagnosis not present

## 2022-06-23 DIAGNOSIS — I129 Hypertensive chronic kidney disease with stage 1 through stage 4 chronic kidney disease, or unspecified chronic kidney disease: Secondary | ICD-10-CM | POA: Diagnosis not present

## 2022-07-07 ENCOUNTER — Ambulatory Visit: Payer: Medicare Other | Admitting: Neurology

## 2022-07-10 DIAGNOSIS — H8110 Benign paroxysmal vertigo, unspecified ear: Secondary | ICD-10-CM | POA: Diagnosis not present

## 2022-08-04 ENCOUNTER — Other Ambulatory Visit: Payer: Self-pay | Admitting: *Deleted

## 2022-08-04 MED ORDER — METOPROLOL TARTRATE 25 MG PO TABS: 12.5000 mg | ORAL_TABLET | Freq: Two times a day (BID) | ORAL | 1 refills | Status: DC

## 2022-08-06 ENCOUNTER — Other Ambulatory Visit: Payer: Self-pay

## 2022-08-06 MED ORDER — METOPROLOL TARTRATE 25 MG PO TABS: 12.5000 mg | ORAL_TABLET | Freq: Two times a day (BID) | ORAL | 2 refills | Status: DC

## 2022-08-13 NOTE — Progress Notes (Unsigned)
Cardiology Office Note:    Date:  08/13/2022   ID:  Elizabeth Russell 1941/09/12, MRN [id number]  PCP:  Elizabeth Stains, MD   [name]  Cardiologist:  Elizabeth Bergeron, MD  Advanced Practice Provider:  No care team member to display Electrophysiologist:  None   Referring MD: Elizabeth Stains, MD     History of Present Illness:    Elizabeth Russell is a 81 y.o. female with a hx of HTN, DMII, GERD, CKD stage 3, and anxiety who presents to clinic for follow-up.   Patient presented to [facility] on 11/18[facility]19 with chest pain that began the night prior after eating. In ED, trop negative x2, ECG with NSR and no ischemic changes, CXR with no acute findings. She was given an antiacid with improvement and referred back to her PCP for further management. At her PCPs office, she was complaining of intermittent episodes of lightheadedness and hypotension for which she was initially referred to Cardiology clinic.  During our visit on 11/26/20, the patient was having episodes of lightheadedness and shortness of breath while standing. Had some associated hypotension during these periods as well. No LOC. We were concerned about orthostatic hypotension and decreased her losartan to 25mg  daily, recommended compression socks, and increased fluid intake.  Saw Elizabeth Russell in 10/17/21 after going to the ER on 10/07/2021 where she was having indigestion symptoms found to be in intermittent Afib. She was discharged on apixaban and metop 25mg  BID. When she saw Elizabeth Russell in clinic, she was doing well. No palpitations.  Seen in clinic 10/2021 where she was doing well with resolution of symptoms since decreasing the losartan and wearing compression socks. No palpitations. Tolerating AC without issues. TTE 10/2021 with LVEF 65-70%, G2DD, mild MR, mildly dilated aorta 63mm.   Was last seen in clinic 02/17/22 where she was having intermittent palpitations that corresponded to brief episodes of Afib on her  Kardia device but was otherwise doing well.   Today, ***   Past Medical History:  Diagnosis Date   Afib ([place])    Hypertension     No past surgical history on file.  Current Medications: No outpatient medications have been marked as taking for the 08/17/22 encounter (Appointment) with Elizabeth Bergeron, MD.     Allergies:   Patient has no known allergies.   Social History   Socioeconomic History   Marital status: Married    Spouse name: Elizabeth Russell   Number of children: 3   Years of education: Not on file   Highest education level: High school graduate  Occupational History   Not on file  Tobacco Use   Smoking status: Never   Smokeless tobacco: Never  Vaping Use   Vaping Use: Never used  Substance and Sexual Activity   Alcohol use: No   Drug use: No   Sexual activity: Not on file  Other Topics Concern   Not on file  Social History Narrative   Lives at home with her husband   Right handed   Caffeine: 1 c coffee a day    Social Determinants of Radio broadcast assistant Strain: Not on file  Food Insecurity: Not on file  Transportation Needs: Not on file  Physical Activity: Not on file  Stress: Not on file  Social Connections: Not on file     Family History: The patient's family history includes Heart Problems in her father; High blood pressure in her mother.  ROS:   Please see  the history of present illness.    All other systems reviewed and are negative.  EKGs/Labs/Other Studies Reviewed:    The following studies were reviewed today: TTE 11/10/21: IMPRESSIONS   1. Left ventricular ejection fraction, by estimation, is 65 to 70%. Left  ventricular ejection fraction by 3D volume is 70 %. The left ventricle has  normal function. The left ventricle has no regional wall motion  abnormalities. There is moderate  asymmetric left ventricular hypertrophy of the basal-septal segment. Left  ventricular diastolic parameters are consistent with Grade II diastolic   dysfunction (pseudonormalization).   2. Right ventricular systolic function is normal. The right ventricular  size is normal. There is normal pulmonary artery systolic pressure. The  estimated right ventricular systolic pressure is 0000000 mmHg.   3. Left atrial size was mildly dilated.   4. The mitral valve is normal in structure. Mild mitral valve  regurgitation.   5. The aortic valve has an indeterminant number of cusps. There is mild  thickening of the aortic valve. Aortic valve regurgitation is trivial.  Aortic valve sclerosis is present, with no evidence of aortic valve  stenosis.   6. Aortic dilatation noted. There is mild dilatation of the ascending  aorta, measuring 39 mm.   7. The inferior vena cava is normal in size with greater than 50%  respiratory variability, suggesting right atrial pressure of 3 mmHg.   EKG:   No new tracing today  Recent Labs: 10/07/2021: ALT 16; BUN 14; Creatinine, Ser 1.31; Hemoglobin 13.4; Platelets 216; Potassium 3.7; Sodium 138; TSH 1.529  Recent Lipid Panel No results found for: "CHOL", "TRIG", "HDL", "CHOLHDL", "VLDL", "LDLCALC", "LDLDIRECT"   Risk Assessment/Calculations:       Physical Exam:    VS:  There were no vitals taken for this visit.    Wt Readings from Last 3 Encounters:  02/17/22 153 lb 9.6 oz (69.7 kg)  12/03/21 151 lb 8 oz (68.7 kg)  10/30/21 150 lb 9.6 oz (68.3 kg)     GEN: Well nourished, well developed in no acute distress, comfortable HEENT: Normal NECK: No JVD; No carotid bruits CARDIAC: RRR, no murmurs, rubs, gallops RESPIRATORY:  CTAB, no wheezes ABDOMEN: Soft, non-tender, non-distended MUSCULOSKELETAL:  No edema; No deformity  SKIN: Warm and dry NEUROLOGIC:  Alert and oriented x 3 PSYCHIATRIC:  Normal affect   ASSESSMENT:    No diagnosis found.   PLAN:    In order of problems listed above:  #Paroxysmal Atrial Fibrillation: CHADs-vasc 5. Diagnosed in 09/2021 when she presented to the ER with  indigestion symptoms. Was discharged on apixaban and metoprolol.Currently, *** -Continue apixaban 5mg  BID -Continue metoprolol 12.5mg  BID with additional dose as needed for palpitations -TTE with normal BiV function   #Orthostatic Hypotension: Significantly improved.  -Continue losartan to 25mg  daily  -Continue compression socks -Continue fluid intake -If symptoms recur, stop losartan all together -Discussed that if she feels faint, she needs to lay down and put her legs up to help with venous return   #HTN: Well controlled -Continue losartan 25mg  daily -Continue metop 12.5mg  BID   #HLD: -Continue pravastatin 80mg  daily   #DMII: Diet controlled and managed by PCP. -Follow-up with PCP as scheduled   #CKD Stage 3: Stable. -Follow-up with PCP as scheduled    Medication Adjustments/Labs and Tests Ordered: Current medicines are reviewed at length with the patient today.  Concerns regarding medicines are outlined above.  No orders of the defined types were placed in this encounter.  No orders  of the defined types were placed in this encounter.   There are no Patient Instructions on file for this visit.   Follow-up as needed.    Signed, Elizabeth Bergeron, MD  08/13/2022 1:47 PM    [facility]

## 2022-08-17 ENCOUNTER — Encounter: Payer: Self-pay | Admitting: Cardiology

## 2022-08-17 ENCOUNTER — Ambulatory Visit: Payer: Medicare Other | Attending: Cardiology | Admitting: Cardiology

## 2022-08-17 VITALS — BP 142/78 | HR 51 | Ht 64.0 in | Wt 149.4 lb

## 2022-08-17 DIAGNOSIS — D6869 Other thrombophilia: Secondary | ICD-10-CM

## 2022-08-17 DIAGNOSIS — I480 Paroxysmal atrial fibrillation: Secondary | ICD-10-CM

## 2022-08-17 DIAGNOSIS — I951 Orthostatic hypotension: Secondary | ICD-10-CM | POA: Diagnosis not present

## 2022-08-17 DIAGNOSIS — E119 Type 2 diabetes mellitus without complications: Secondary | ICD-10-CM

## 2022-08-17 DIAGNOSIS — I10 Essential (primary) hypertension: Secondary | ICD-10-CM | POA: Diagnosis not present

## 2022-08-17 DIAGNOSIS — E782 Mixed hyperlipidemia: Secondary | ICD-10-CM

## 2022-08-17 MED ORDER — APIXABAN 5 MG PO TABS: 5.0000 mg | ORAL_TABLET | Freq: Two times a day (BID) | ORAL | 11 refills | Status: DC

## 2022-08-17 MED ORDER — LOSARTAN POTASSIUM 25 MG PO TABS: ORAL_TABLET | ORAL | 2 refills | Status: DC

## 2022-08-17 NOTE — Patient Instructions (Signed)
Medication Instructions:   HOLD YOUR PRAVASTATIN FOR 5 DAYS ONLY, THEN CALL OR MYCHART DR. [place] THEREAFTER, TO REPORT IF YOUR SYMPTOMS IMPROVED OR NOT  INCREASE YOUR LOSARTAN TO TAKING 1 TABLET (25 MG TOTAL) BY MOUTH IN THE MORNING, THEN TAKE 1/2 TABLET (12.5 MG TOTAL) BY MOUTH IN THE EVENING, EVERYDAY   *If you need a refill on your cardiac medications before your next appointment, please call your pharmacy*   Follow-Up: At [facility], you and your health needs are our priority.  As part of our continuing mission to provide you with exceptional heart care, we have created designated Provider Care Teams.  These Care Teams include your primary Cardiologist (physician) and Advanced Practice Providers (APPs -  Physician Assistants and Nurse Practitioners) who all work together to provide you with the care you need, when you need it.  We recommend signing up for the patient portal called "MyChart".  Sign up information is provided on this After Visit Summary.  MyChart is used to connect with patients for Virtual Visits (Telemedicine).  Patients are able to view lab/test results, encounter notes, upcoming appointments, etc.  Non-urgent messages can be sent to your provider as well.   To learn more about what you can do with MyChart, go to [email or web address].    Your next appointment:   6 month(s)  The format for your next appointment:   In Person  Provider:   Freada Bergeron, MD      Important Information About Sugar

## 2022-08-17 NOTE — Progress Notes (Signed)
Cardiology Office Note:    Date:  08/17/2022   ID:  [name], [name] May 12, 1941, MRN [id number]  PCP:  Harlan Stains, MD   [place]  Cardiologist:  Freada Bergeron, MD  Advanced Practice Provider:  No care team member to display Electrophysiologist:  None   Referring MD: Harlan Stains, MD     History of Present Illness:    Elizabeth Russell is a 81 y.o. female with a hx of HTN, DMII, GERD, CKD stage 3, and anxiety who presents to clinic for follow-up.   Patient presented to [facility] on 11/18/19 with chest pain that began the night prior after eating. In ED, trop negative x2, ECG with NSR and no ischemic changes, CXR with no acute findings. She was given an antiacid with improvement and referred back to her PCP for further management. At her PCPs office, she was complaining of intermittent episodes of lightheadedness and hypotension for which she was initially referred to Cardiology clinic.  During our visit on 11/26/20, the patient was having episodes of lightheadedness and shortness of breath while standing. Had some associated hypotension during these periods as well. No LOC. We were concerned about orthostatic hypotension and decreased her losartan to 25mg  daily, recommended compression socks, and increased fluid intake.  Saw [name] in 10/17/21 after going to the ER on 10/07/2021 where she was having indigestion symptoms found to be in intermittent Afib. She was discharged on apixaban and metop 25mg  BID. When she saw [facility] in clinic, she was doing well. No palpitations.  Seen in clinic 10/2021 where she was doing well with resolution of symptoms since decreasing the losartan and wearing compression socks. No palpitations. Tolerating AC without issues. TTE 10/2021 with LVEF 65-70%, G2DD, mild MR, mildly dilated aorta 55mm.   Was last seen in clinic 02/17/22 where she was having intermittent palpitations that corresponded to brief episodes of Afib on her  Kardia device but was otherwise doing well.   Today, she says she is feeling good. She states she has her good days and bad days. Specifically, she reports that sometimes she will feel very tired in the morning, and activities such as making breakfast will make her more tired. She has noticed that her blood pressure is elevated during these instances mainly in the 140-150s.  Additionally, she mentions that she has been urinating more frequently, lately. She is wondering if that is related to her medications.  She also states that she sometimes has trouble remembering things. She has been noticing these types of changes since turning 80 last year, but she is concerned it may be related to her medications.  Otherwise, she is doing well. No chest pain. Palpitations are rare with no recent episodes of Afib. No SOB, orthopnea or PND. Lightheadedness/dizziness has resolved.   Past Medical History:  Diagnosis Date   Afib ([place])    Hypertension     No past surgical history on file.  Current Medications: Current Meds  Medication Sig   acetaminophen (TYLENOL) 500 MG tablet Take 500-1,000 mg by mouth every 6 (six) hours as needed for mild pain or headache.   COVID-19 mRNA bivalent vaccine, Pfizer, (PFIZER COVID-19 VAC BIVALENT) injection Inject into the muscle.   losartan (COZAAR) 25 MG tablet Take 1 tablet (25 mg total) by mouth in the morning, then take 1/2 tablet (12.5 mg total) by mouth in the evening, everyday.   melatonin 3 MG TABS tablet Take 1 tablet (3 mg total) by mouth  at bedtime.   metoprolol tartrate (LOPRESSOR) 25 MG tablet Take 0.5 tablets (12.5 mg total) by mouth 2 (two) times daily. May take an extra 1/2 dose as needed if HR>100 bpm.   pravastatin (PRAVACHOL) 80 MG tablet Take 1 tablet (80 mg total) by mouth daily.   traZODone (DESYREL) 50 MG tablet Take 0.5 tablets (25 mg total) by mouth at bedtime.   [DISCONTINUED] apixaban (ELIQUIS) 5 MG TABS tablet Take 1 tablet (5 mg total) by  mouth 2 (two) times daily.   [DISCONTINUED] losartan (COZAAR) 25 MG tablet Take 1 tablet (25 mg total) by mouth daily.     Allergies:   Patient has no known allergies.   Social History   Socioeconomic History   Marital status: Married    Spouse name: Jeneen Rinks   Number of children: 3   Years of education: Not on file   Highest education level: High school graduate  Occupational History   Not on file  Tobacco Use   Smoking status: Never   Smokeless tobacco: Never  Vaping Use   Vaping Use: Never used  Substance and Sexual Activity   Alcohol use: No   Drug use: No   Sexual activity: Not on file  Other Topics Concern   Not on file  Social History Narrative   Lives at home with her husband   Right handed   Caffeine: 1 c coffee a day    Social Determinants of Radio broadcast assistant Strain: Not on file  Food Insecurity: Not on file  Transportation Needs: Not on file  Physical Activity: Not on file  Stress: Not on file  Social Connections: Not on file     Family History: The patient's family history includes Heart Problems in her father; High blood pressure in her mother.  ROS:   Please see the history of present illness.    (+) Fatigue  (+) Frequent urination  (+) Anxiety   All other systems reviewed and are negative.  EKGs/Labs/Other Studies Reviewed:    The following studies were reviewed today:  TTE 10/2021: IMPRESSIONS   1. Left ventricular ejection fraction, by estimation, is 65 to 70%. Left  ventricular ejection fraction by 3D volume is 70 %. The left ventricle has  normal function. The left ventricle has no regional wall motion  abnormalities. There is moderate  asymmetric left ventricular hypertrophy of the basal-septal segment. Left  ventricular diastolic parameters are consistent with Grade II diastolic  dysfunction (pseudonormalization).   2. Right ventricular systolic function is normal. The right ventricular  size is normal. There is normal  pulmonary artery systolic pressure. The  estimated right ventricular systolic pressure is 52.7 mmHg.   3. Left atrial size was mildly dilated.   4. The mitral valve is normal in structure. Mild mitral valve  regurgitation.   5. The aortic valve has an indeterminant number of cusps. There is mild  thickening of the aortic valve. Aortic valve regurgitation is trivial.  Aortic valve sclerosis is present, with no evidence of aortic valve  stenosis.   6. Aortic dilatation noted. There is mild dilatation of the ascending  aorta, measuring 39 mm.   7. The inferior vena cava is normal in size with greater than 50%  respiratory variability, suggesting right atrial pressure of 3 mmHg.   EKG:  EKG is personally reviewed. 08/17/22: Sinus bradycardia. Rate 51 bpm. No new tracing today  Recent Labs: 10/07/2021: ALT 16; BUN 14; Creatinine, Ser 1.31; Hemoglobin 13.4; Platelets 216; Potassium  3.7; Sodium 138; TSH 1.529  Recent Lipid Panel No results found for: "CHOL", "TRIG", "HDL", "CHOLHDL", "VLDL", "LDLCALC", "LDLDIRECT"   Risk Assessment/Calculations:       Physical Exam:    VS:  BP (!) 142/78 (BP Location: Left Arm, Patient Position: Sitting, Cuff Size: Normal)   Pulse (!) 51   Ht 5\' 4"  (1.626 m)   Wt 149 lb 6.4 oz (67.8 kg)   SpO2 98%   BMI 25.64 kg/m     Wt Readings from Last 3 Encounters:  08/17/22 149 lb 6.4 oz (67.8 kg)  02/17/22 153 lb 9.6 oz (69.7 kg)  12/03/21 151 lb 8 oz (68.7 kg)     GEN: Comfortable, NAD HEENT: Normal NECK: No JVD; No carotid bruits CARDIAC: RRR, no murmurs, rubs, gallops RESPIRATORY:  clear bilaterally ABDOMEN: Soft, non-tender, non-distended MUSCULOSKELETAL:  No edema; No deformity  SKIN: Warm and dry NEUROLOGIC:  Alert and oriented x 3 PSYCHIATRIC:  Normal affect   ASSESSMENT:    1. Paroxysmal atrial fibrillation (HCC)   2. Hypercoagulable state due to paroxysmal atrial fibrillation (HCC)   3. Orthostatic hypotension   4. Primary  hypertension   5. Mixed hyperlipidemia   6. Type 2 diabetes mellitus without complication, without long-term current use of insulin (HCC)      PLAN:    In order of problems listed above:  #Paroxysmal Atrial Fibrillation: CHADs-vasc 5. Diagnosed in 09/2021 when she presented to the ER with indigestion symptoms. Was discharged on apixaban and metoprolol.Currently, doing well with very rare palpitations. -Continue apixaban 5mg  BID -Continue metoprolol 12.5mg  BID with additional dose as needed for palpitations -TTE with normal BiV function   #Orthostatic Hypotension: Resolved but now with hypertension as detailed below. -Continue compression socks -Continue fluid intake   #HTN: Elevated running 140s mainly at home but can go as high as 150s and feels fatigued when this happens. Will increase losartan to 25mg  in AM and 12.5mg  in the PM and monitor for orthostatic symptoms. -Increase losartan to 25mg  in AM and 12.5mg  in PM -Continue metop 12.5mg  BID   #HLD: #Memory Issues: Patient is concerned that her statin is contributing to her memory issues. Will do a trial off the pravastatin for 5 days and see if her memory issues improve. If so, we can change her to something else. If not, will resume. -Can trial off pravastatin to see if memory issues improve -If so, will change to alternative med. If not, she will resume the medication  #DMII: Diet controlled and managed by PCP. -Follow-up with PCP as scheduled   #CKD Stage 3: Stable. -Follow-up with PCP as scheduled  Follow-up: 6 months.  Medication Adjustments/Labs and Tests Ordered: Current medicines are reviewed at length with the patient today.  Concerns regarding medicines are outlined above.  Orders Placed This Encounter  Procedures   EKG 12-Lead   Meds ordered this encounter  Medications   apixaban (ELIQUIS) 5 MG TABS tablet    Sig: Take 1 tablet (5 mg total) by mouth 2 (two) times daily.    Dispense:  60 tablet     Refill:  11   losartan (COZAAR) 25 MG tablet    Sig: Take 1 tablet (25 mg total) by mouth in the morning, then take 1/2 tablet (12.5 mg total) by mouth in the evening, everyday.    Dispense:  135 tablet    Refill:  2    Dose increase   Patient Instructions  Medication Instructions:   HOLD YOUR PRAVASTATIN FOR  5 DAYS ONLY, THEN CALL OR MYCHART DR. Felina Tello THEREAFTER, TO REPORT IF YOUR SYMPTOMS IMPROVED OR NOT  INCREASE YOUR LOSARTAN TO TAKING 1 TABLET (25 MG TOTAL) BY MOUTH IN THE MORNING, THEN TAKE 1/2 TABLET (12.5 MG TOTAL) BY MOUTH IN THE EVENING, EVERYDAY   *If you need a refill on your cardiac medications before your next appointment, please call your pharmacy*   Follow-Up: At [facility], you and your health needs are our priority.  As part of our continuing mission to provide you with exceptional heart care, we have created designated Provider Care Teams.  These Care Teams include your primary Cardiologist (physician) and Advanced Practice Providers (APPs -  Physician Assistants and Nurse Practitioners) who all work together to provide you with the care you need, when you need it.  We recommend signing up for the patient portal called "MyChart".  Sign up information is provided on this After Visit Summary.  MyChart is used to connect with patients for Virtual Visits (Telemedicine).  Patients are able to view lab/test results, encounter notes, upcoming appointments, etc.  Non-urgent messages can be sent to your provider as well.   To learn more about what you can do with MyChart, go to [email or web address].    Your next appointment:   6 month(s)  The format for your next appointment:   In Person  Provider:   Freada Bergeron, MD      Important Information About Sugar          I,Breanna Adamick,acting as a scribe for Freada Bergeron, MD.,have documented all relevant documentation on the behalf of Freada Bergeron, MD,as directed by  Freada Bergeron, MD while in the presence of Freada Bergeron, MD.  I, Freada Bergeron, MD, have reviewed all documentation for this visit. The documentation on 08/17/22 for the exam, diagnosis, procedures, and orders are all accurate and complete.   Signed, Freada Bergeron, MD  08/17/2022 10:51 AM    [facility]

## 2022-08-20 ENCOUNTER — Other Ambulatory Visit: Payer: Self-pay

## 2022-08-20 MED ORDER — METOPROLOL TARTRATE 25 MG PO TABS: 12.5000 mg | ORAL_TABLET | Freq: Two times a day (BID) | ORAL | 3 refills | Status: DC

## 2022-08-24 ENCOUNTER — Other Ambulatory Visit: Payer: Self-pay | Admitting: *Deleted

## 2022-08-24 ENCOUNTER — Telehealth: Payer: Self-pay | Admitting: Cardiology

## 2022-08-24 MED ORDER — EZETIMIBE 10 MG PO TABS: 10.0000 mg | ORAL_TABLET | Freq: Every day | ORAL | 11 refills | Status: DC

## 2022-08-24 MED ORDER — METOPROLOL TARTRATE 25 MG PO TABS: 12.5000 mg | ORAL_TABLET | Freq: Two times a day (BID) | ORAL | 3 refills | Status: DC

## 2022-08-24 NOTE — Telephone Encounter (Signed)
Spoke with patient and advised per Dr. Johney Frame she can stop taking pravastatin and start taking Zetia 10 mg daily.  Script sent to he pharmacy.  Patient verbalized understanding.

## 2022-08-24 NOTE — Telephone Encounter (Signed)
Pt c/o medication issue:  1. Name of Medication: Pravastatin  2. How are you currently taking this medication (dosage and times per day)?   3. Are you having a reaction (difficulty breathing--STAT)?   4. What is your medication issue?  Was told to stop taking this medicine on 08-18-22 and report back to Dr Johney Frame on how she was  doing after she stopped. Patient says she feel much improved

## 2022-08-31 ENCOUNTER — Telehealth: Payer: Self-pay | Admitting: Cardiology

## 2022-08-31 NOTE — Telephone Encounter (Signed)
Jordyn, Hofacker - 08/31/2022 11:53 AM Freada Bergeron, MD  Sent: Mon August 31, 2022 12:55 PM  To: Winifred Olive M, LPN; P Cv Div Preop          Message  A tooth filling should be no issue while on a blood thinner. It is only when they are pulling teeth that they usually request that the blood thinner be held.     Pt aware of recommendations per Dr. Johney Frame, as indicated above. Pt verbalized understanding and agrees with this plan.

## 2022-08-31 NOTE — Telephone Encounter (Signed)
Pt c/o medication issue:  1. Name of Medication: apixaban (ELIQUIS) 5 MG TABS tablet  2. How are you currently taking this medication (dosage and times per day)? 1 tablet twice a day  3. Are you having a reaction (difficulty breathing--STAT)? no  4. What is your medication issue? Patient states she is having a tooth filled and wants to know if this is okay while she is on a blood thinner. She says her dentist is not requesting clearance, she is wanting to know herself.

## 2022-09-08 DIAGNOSIS — H26491 Other secondary cataract, right eye: Secondary | ICD-10-CM | POA: Diagnosis not present

## 2022-09-08 DIAGNOSIS — Z961 Presence of intraocular lens: Secondary | ICD-10-CM | POA: Diagnosis not present

## 2022-09-08 DIAGNOSIS — R7309 Other abnormal glucose: Secondary | ICD-10-CM | POA: Diagnosis not present

## 2022-09-08 DIAGNOSIS — H40013 Open angle with borderline findings, low risk, bilateral: Secondary | ICD-10-CM | POA: Diagnosis not present

## 2022-09-08 DIAGNOSIS — H04123 Dry eye syndrome of bilateral lacrimal glands: Secondary | ICD-10-CM | POA: Diagnosis not present

## 2022-09-29 DIAGNOSIS — Z1231 Encounter for screening mammogram for malignant neoplasm of breast: Secondary | ICD-10-CM | POA: Diagnosis not present

## 2022-10-01 ENCOUNTER — Encounter: Payer: Self-pay | Admitting: Family Medicine

## 2022-10-01 DIAGNOSIS — N183 Chronic kidney disease, stage 3 unspecified: Secondary | ICD-10-CM | POA: Diagnosis not present

## 2022-10-01 DIAGNOSIS — G4752 REM sleep behavior disorder: Secondary | ICD-10-CM | POA: Diagnosis not present

## 2022-10-01 DIAGNOSIS — E785 Hyperlipidemia, unspecified: Secondary | ICD-10-CM | POA: Diagnosis not present

## 2022-10-01 DIAGNOSIS — E1121 Type 2 diabetes mellitus with diabetic nephropathy: Secondary | ICD-10-CM | POA: Diagnosis not present

## 2022-10-01 DIAGNOSIS — I480 Paroxysmal atrial fibrillation: Secondary | ICD-10-CM | POA: Diagnosis not present

## 2022-10-01 DIAGNOSIS — Z23 Encounter for immunization: Secondary | ICD-10-CM | POA: Diagnosis not present

## 2022-10-01 DIAGNOSIS — R42 Dizziness and giddiness: Secondary | ICD-10-CM | POA: Diagnosis not present

## 2022-10-01 DIAGNOSIS — I129 Hypertensive chronic kidney disease with stage 1 through stage 4 chronic kidney disease, or unspecified chronic kidney disease: Secondary | ICD-10-CM | POA: Diagnosis not present

## 2022-10-01 DIAGNOSIS — R413 Other amnesia: Secondary | ICD-10-CM | POA: Diagnosis not present

## 2022-10-05 ENCOUNTER — Other Ambulatory Visit: Payer: Self-pay | Admitting: Family Medicine

## 2022-10-05 DIAGNOSIS — R413 Other amnesia: Secondary | ICD-10-CM

## 2022-10-12 DIAGNOSIS — E538 Deficiency of other specified B group vitamins: Secondary | ICD-10-CM | POA: Diagnosis not present

## 2022-10-19 DIAGNOSIS — E538 Deficiency of other specified B group vitamins: Secondary | ICD-10-CM | POA: Diagnosis not present

## 2022-10-26 DIAGNOSIS — E1121 Type 2 diabetes mellitus with diabetic nephropathy: Secondary | ICD-10-CM | POA: Diagnosis not present

## 2022-10-26 DIAGNOSIS — E538 Deficiency of other specified B group vitamins: Secondary | ICD-10-CM | POA: Diagnosis not present

## 2022-11-02 DIAGNOSIS — E538 Deficiency of other specified B group vitamins: Secondary | ICD-10-CM | POA: Diagnosis not present

## 2022-11-18 ENCOUNTER — Ambulatory Visit
Admission: RE | Admit: 2022-11-18 | Discharge: 2022-11-18 | Disposition: A | Discharge status: Home / Self Care | Payer: Medicare Other | Source: Ambulatory Visit | Attending: Family Medicine | Admitting: Family Medicine

## 2022-11-18 DIAGNOSIS — R413 Other amnesia: Secondary | ICD-10-CM

## 2022-11-18 DIAGNOSIS — R410 Disorientation, unspecified: Secondary | ICD-10-CM | POA: Diagnosis not present

## 2022-12-29 DIAGNOSIS — E119 Type 2 diabetes mellitus without complications: Secondary | ICD-10-CM | POA: Diagnosis not present

## 2022-12-29 DIAGNOSIS — I4891 Unspecified atrial fibrillation: Secondary | ICD-10-CM | POA: Diagnosis not present

## 2022-12-29 DIAGNOSIS — G3184 Mild cognitive impairment, so stated: Secondary | ICD-10-CM | POA: Diagnosis not present

## 2022-12-29 DIAGNOSIS — E538 Deficiency of other specified B group vitamins: Secondary | ICD-10-CM | POA: Diagnosis not present

## 2022-12-29 DIAGNOSIS — R634 Abnormal weight loss: Secondary | ICD-10-CM | POA: Diagnosis not present

## 2023-02-12 ENCOUNTER — Ambulatory Visit: Payer: Medicare Other | Admitting: Cardiology

## 2023-02-17 NOTE — Progress Notes (Signed)
Cardiology Office Note:    Date:  02/19/2023   ID:  Donovan, Dehaas Oct 10, 1941, MRN [id number]  PCP:  [name], MD   [facility]  Cardiologist:  [name], MD  Advanced Practice Provider:  No care team member to display Electrophysiologist:  None   Referring MD: [name], MD     History of Present Illness:    Elizabeth Russell is a 82 y.o. female with a hx of HTN, DMII, GERD, CKD stage 3, and anxiety who presents to clinic for follow-up.   Patient presented to [facility] on 11/18/19 with chest pain that began the night prior after eating. In ED, trop negative x2, ECG with NSR and no ischemic changes, CXR with no acute findings. She was given an antiacid with improvement and referred back to her PCP for further management. At her PCPs office, she was complaining of intermittent episodes of lightheadedness and hypotension for which she was initially referred to Cardiology clinic.  During our visit on 11/26/20, the patient was having episodes of lightheadedness and shortness of breath while standing. Had some associated hypotension during these periods as well. No LOC. We were concerned about orthostatic hypotension and decreased her losartan to 25mg  daily, recommended compression socks, and increased fluid intake.  Saw Gillian Shields in 10/17/21 after going to the ER on 10/07/2021 where she was having indigestion symptoms found to be in intermittent Afib. She was discharged on apixaban and metop 25mg  BID. TTE 10/2021 with LVEF 65-70%, G2DD, mild MR, mildly dilated aorta 85mm.   Was last seen in clinic on 08/2022 where she was having some memory issues. We trialed off pravastatin to see if that helped and she felt better. We changed to zetia instead.  Today, the patient overall feels well. Has rare occasional flutters but this does not sustain. Continues to have anxiety when she is out by herself, but she is discussing this with Dr. Cliffton Asters. Otherwise, no chest  pain, SOB, orthopnea, PND or LE edema. Tolerating apixaban as prescribed without issues.  Remains active around the house without exertional symptoms.   Past Medical History:  Diagnosis Date   Afib    Hypertension     History reviewed. No pertinent surgical history.  Current Medications: Current Meds  Medication Sig   acetaminophen (TYLENOL) 500 MG tablet Take 500-1,000 mg by mouth every 6 (six) hours as needed for mild pain or headache.   apixaban (ELIQUIS) 5 MG TABS tablet Take 1 tablet (5 mg total) by mouth 2 (two) times daily.   COVID-19 mRNA bivalent vaccine, Pfizer, (PFIZER COVID-19 VAC BIVALENT) injection Inject into the muscle.   ezetimibe (ZETIA) 10 MG tablet Take 1 tablet (10 mg total) by mouth daily.   losartan (COZAAR) 25 MG tablet Take 1 tablet (25 mg total) by mouth in the morning, then take 1/2 tablet (12.5 mg total) by mouth in the evening, everyday.   melatonin 3 MG TABS tablet Take 1 tablet (3 mg total) by mouth at bedtime.   metoprolol tartrate (LOPRESSOR) 25 MG tablet Take 0.5 tablets (12.5 mg total) by mouth 2 (two) times daily. May take an extra 1/2 dose as needed if HR>100 bpm.   traZODone (DESYREL) 50 MG tablet Take 0.5 tablets (25 mg total) by mouth at bedtime.     Allergies:   Patient has no known allergies.   Social History   Socioeconomic History   Marital status: Married    Spouse name: Fayrene Fearing   Number  of children: 3   Years of education: Not on file   Highest education level: High school graduate  Occupational History   Not on file  Tobacco Use   Smoking status: Never   Smokeless tobacco: Never  Vaping Use   Vaping Use: Never used  Substance and Sexual Activity   Alcohol use: No   Drug use: No   Sexual activity: Not on file  Other Topics Concern   Not on file  Social History Narrative   Lives at home with her husband   Right handed   Caffeine: 1 c coffee a day    Social Determinants of Health   Financial Resource Strain: Not on file   Food Insecurity: Not on file  Transportation Needs: Not on file  Physical Activity: Not on file  Stress: Not on file  Social Connections: Not on file     Family History: The patient's family history includes Heart Problems in her father; High blood pressure in her mother.  ROS:   Please see the history of present illness.      EKGs/Labs/Other Studies Reviewed:    The following studies were reviewed today:  TTE 10/2021: IMPRESSIONS   1. Left ventricular ejection fraction, by estimation, is 65 to 70%. Left  ventricular ejection fraction by 3D volume is 70 %. The left ventricle has  normal function. The left ventricle has no regional wall motion  abnormalities. There is moderate  asymmetric left ventricular hypertrophy of the basal-septal segment. Left  ventricular diastolic parameters are consistent with Grade II diastolic  dysfunction (pseudonormalization).   2. Right ventricular systolic function is normal. The right ventricular  size is normal. There is normal pulmonary artery systolic pressure. The  estimated right ventricular systolic pressure is 27.8 mmHg.   3. Left atrial size was mildly dilated.   4. The mitral valve is normal in structure. Mild mitral valve  regurgitation.   5. The aortic valve has an indeterminant number of cusps. There is mild  thickening of the aortic valve. Aortic valve regurgitation is trivial.  Aortic valve sclerosis is present, with no evidence of aortic valve  stenosis.   6. Aortic dilatation noted. There is mild dilatation of the ascending  aorta, measuring 39 mm.   7. The inferior vena cava is normal in size with greater than 50%  respiratory variability, suggesting right atrial pressure of 3 mmHg.   EKG:  No new ECG today  Recent Labs: No results found for requested labs within last 365 days.  Recent Lipid Panel No results found for: "CHOL", "TRIG", "HDL", "CHOLHDL", "VLDL", "LDLCALC", "LDLDIRECT"   Risk Assessment/Calculations:        Physical Exam:    VS:  BP 112/74   Pulse 96   Ht  (1.626 m)   Wt 140 lb (63.5 kg)   SpO2 (!) 51%   BMI 24.03 kg/m     Wt Readings from Last 3 Encounters:  02/19/23 140 lb (63.5 kg)  08/17/22 149 lb 6.4 oz (67.8 kg)  02/17/22 153 lb 9.6 oz (69.7 kg)     GEN: Comfortable, well appearing HEENT: Normal NECK: No JVD; No carotid bruits CARDIAC: RRR, no murmurs, rubs, gallops RESPIRATORY:  CTAB, no wheezes ABDOMEN: Soft, non-tender, non-distended MUSCULOSKELETAL:  No edema; No deformity  SKIN: Warm and dry NEUROLOGIC:  Alert and oriented x 3 PSYCHIATRIC:  Normal affect   ASSESSMENT:    1. Paroxysmal atrial fibrillation   2. Hypercoagulable state due to paroxysmal atrial fibrillation  3. Mixed hyperlipidemia   4. Primary hypertension   5. Pure hypercholesterolemia   6. Type 2 diabetes mellitus without complication, without long-term current use of insulin   7. Orthostatic hypotension       PLAN:    In order of problems listed above:  #Paroxysmal Atrial Fibrillation: CHADs-vasc 5. Diagnosed in 09/2021 when she presented to the ER with indigestion symptoms. Was discharged on apixaban and metoprolol.Currently, doing well with very rare palpitations. -Continue apixaban 5mg  BID -Continue metoprolol 12.5mg  BID with additional dose as needed for palpitations -TTE with normal BiV function   #Orthostatic Hypotension: Resolved. -Continue compression socks -Continue fluid intake   #HTN: Blood pressure is very well controlled today. -Continue losartan to 25mg  in AM and 12.5mg  in PM -Continue metop 12.5mg  BID   #HLD: #Memory Issues: Memory improved with stopping pravastatin. Now on zetia 10mg  daily. -Continue zetia 10mg  daily -Had memory issues with statins  #DMII: Diet controlled and managed by PCP. -Follow-up with PCP as scheduled   #CKD Stage 3: Stable. -Follow-up with PCP as scheduled  Follow-up: 6 months.  Medication Adjustments/Labs and  Tests Ordered: Current medicines are reviewed at length with the patient today.  Concerns regarding medicines are outlined above.  No orders of the defined types were placed in this encounter.  No orders of the defined types were placed in this encounter.  There are no Patient Instructions on file for this visit.     Signed, [name], MD  02/19/2023 10:02 AM    [facility]

## 2023-02-19 ENCOUNTER — Ambulatory Visit: Payer: Medicare Other | Attending: Cardiology | Admitting: Cardiology

## 2023-02-19 ENCOUNTER — Encounter: Payer: Self-pay | Admitting: Cardiology

## 2023-02-19 VITALS — BP 112/74 | HR 96 | Ht 64.0 in | Wt 140.0 lb

## 2023-02-19 DIAGNOSIS — E782 Mixed hyperlipidemia: Secondary | ICD-10-CM | POA: Diagnosis not present

## 2023-02-19 DIAGNOSIS — I480 Paroxysmal atrial fibrillation: Secondary | ICD-10-CM

## 2023-02-19 DIAGNOSIS — E119 Type 2 diabetes mellitus without complications: Secondary | ICD-10-CM

## 2023-02-19 DIAGNOSIS — D6869 Other thrombophilia: Secondary | ICD-10-CM | POA: Diagnosis not present

## 2023-02-19 DIAGNOSIS — E7800 Pure hypercholesterolemia, unspecified: Secondary | ICD-10-CM

## 2023-02-19 DIAGNOSIS — I10 Essential (primary) hypertension: Secondary | ICD-10-CM | POA: Diagnosis not present

## 2023-02-19 DIAGNOSIS — I951 Orthostatic hypotension: Secondary | ICD-10-CM | POA: Diagnosis not present

## 2023-02-19 NOTE — Patient Instructions (Signed)
Medication Instructions:   Your physician recommends that you continue on your current medications as directed. Please refer to the Current Medication list given to you today.  *If you need a refill on your cardiac medications before your next appointment, please call your pharmacy*    Follow-Up: At [facility], you and your health needs are our priority.  As part of our continuing mission to provide you with exceptional heart care, we have created designated Provider Care Teams.  These Care Teams include your primary Cardiologist (physician) and Advanced Practice Providers (APPs -  Physician Assistants and Nurse Practitioners) who all work together to provide you with the care you need, when you need it.  We recommend signing up for the patient portal called "MyChart".  Sign up information is provided on this After Visit Summary.  MyChart is used to connect with patients for Virtual Visits (Telemedicine).  Patients are able to view lab/test results, encounter notes, upcoming appointments, etc.  Non-urgent messages can be sent to your provider as well.   To learn more about what you can do with MyChart, go to [email or web address].    Your next appointment:   6 month(s)  Provider:   Heather E Pemberton, MD       

## 2023-06-04 DIAGNOSIS — E538 Deficiency of other specified B group vitamins: Secondary | ICD-10-CM | POA: Diagnosis not present

## 2023-06-04 DIAGNOSIS — G3184 Mild cognitive impairment, so stated: Secondary | ICD-10-CM | POA: Diagnosis not present

## 2023-06-04 DIAGNOSIS — I129 Hypertensive chronic kidney disease with stage 1 through stage 4 chronic kidney disease, or unspecified chronic kidney disease: Secondary | ICD-10-CM | POA: Diagnosis not present

## 2023-06-04 DIAGNOSIS — E785 Hyperlipidemia, unspecified: Secondary | ICD-10-CM | POA: Diagnosis not present

## 2023-06-04 DIAGNOSIS — I480 Paroxysmal atrial fibrillation: Secondary | ICD-10-CM | POA: Diagnosis not present

## 2023-06-04 DIAGNOSIS — N183 Chronic kidney disease, stage 3 unspecified: Secondary | ICD-10-CM | POA: Diagnosis not present

## 2023-06-04 DIAGNOSIS — E1122 Type 2 diabetes mellitus with diabetic chronic kidney disease: Secondary | ICD-10-CM | POA: Diagnosis not present

## 2023-06-04 DIAGNOSIS — Z0000 Encounter for general adult medical examination without abnormal findings: Secondary | ICD-10-CM | POA: Diagnosis not present

## 2023-06-04 DIAGNOSIS — D6869 Other thrombophilia: Secondary | ICD-10-CM | POA: Diagnosis not present

## 2023-06-10 DIAGNOSIS — E1121 Type 2 diabetes mellitus with diabetic nephropathy: Secondary | ICD-10-CM | POA: Diagnosis not present

## 2023-06-10 DIAGNOSIS — N183 Chronic kidney disease, stage 3 unspecified: Secondary | ICD-10-CM | POA: Diagnosis not present

## 2023-06-10 DIAGNOSIS — E538 Deficiency of other specified B group vitamins: Secondary | ICD-10-CM | POA: Diagnosis not present

## 2023-06-10 DIAGNOSIS — N1831 Chronic kidney disease, stage 3a: Secondary | ICD-10-CM | POA: Diagnosis not present

## 2023-07-06 ENCOUNTER — Other Ambulatory Visit: Payer: Self-pay

## 2023-07-06 MED ORDER — LOSARTAN POTASSIUM 25 MG PO TABS: ORAL_TABLET | ORAL | 2 refills | Status: DC

## 2023-07-12 ENCOUNTER — Telehealth: Payer: Self-pay | Admitting: Cardiology

## 2023-07-12 ENCOUNTER — Other Ambulatory Visit: Payer: Self-pay | Admitting: *Deleted

## 2023-07-12 MED ORDER — LOSARTAN POTASSIUM 25 MG PO TABS: ORAL_TABLET | ORAL | 2 refills | Status: AC

## 2023-07-12 NOTE — Telephone Encounter (Signed)
Losartan refill has already been sent in.

## 2023-07-12 NOTE — Telephone Encounter (Signed)
*  STAT* If patient is at the pharmacy, call can be transferred to refill team.   1. Which medications need to be refilled? (please list name of each medication and dose if known)   losartan (COZAAR) 25 MG tablet    2. Which pharmacy/location (including street and city if local pharmacy) is medication to be sent to? [id number] - [place] ([place]), [place] - [address]    3. Do they need a 30 day or 90 day supply? 30 day

## 2023-08-09 ENCOUNTER — Ambulatory Visit: Payer: Medicare Other | Attending: Cardiology | Admitting: Cardiology

## 2023-08-09 ENCOUNTER — Encounter: Payer: Self-pay | Admitting: Cardiology

## 2023-08-09 ENCOUNTER — Ambulatory Visit: Payer: Medicare Other | Admitting: Cardiology

## 2023-08-09 VITALS — BP 120/82 | HR 53 | Ht 64.0 in | Wt 135.2 lb

## 2023-08-09 DIAGNOSIS — I951 Orthostatic hypotension: Secondary | ICD-10-CM

## 2023-08-09 DIAGNOSIS — G4709 Other insomnia: Secondary | ICD-10-CM

## 2023-08-09 DIAGNOSIS — I10 Essential (primary) hypertension: Secondary | ICD-10-CM | POA: Diagnosis not present

## 2023-08-09 DIAGNOSIS — N183 Chronic kidney disease, stage 3 unspecified: Secondary | ICD-10-CM | POA: Insufficient documentation

## 2023-08-09 DIAGNOSIS — R072 Precordial pain: Secondary | ICD-10-CM

## 2023-08-09 DIAGNOSIS — Z794 Long term (current) use of insulin: Secondary | ICD-10-CM

## 2023-08-09 DIAGNOSIS — R0789 Other chest pain: Secondary | ICD-10-CM

## 2023-08-09 DIAGNOSIS — E7800 Pure hypercholesterolemia, unspecified: Secondary | ICD-10-CM

## 2023-08-09 DIAGNOSIS — N1831 Chronic kidney disease, stage 3a: Secondary | ICD-10-CM | POA: Diagnosis not present

## 2023-08-09 DIAGNOSIS — I480 Paroxysmal atrial fibrillation: Secondary | ICD-10-CM

## 2023-08-09 DIAGNOSIS — E1121 Type 2 diabetes mellitus with diabetic nephropathy: Secondary | ICD-10-CM | POA: Diagnosis not present

## 2023-08-09 DIAGNOSIS — E119 Type 2 diabetes mellitus without complications: Secondary | ICD-10-CM | POA: Insufficient documentation

## 2023-08-09 NOTE — Patient Instructions (Signed)
Medication Instructions:   Your physician recommends that you continue on your current medications as directed. Please refer to the Current Medication list given to you today.  *If you need a refill on your cardiac medications before your next appointment, please call your pharmacy*   Lab Work:  SOMETIME THIS WEEK HERE IN THE OFFICE--LIPIDS, ALT, AND BMET--PLEASE COME FASTING TO THIS LAB APPOINTMENT  If you have labs (blood work) drawn today and your tests are completely normal, you will receive your results only by: MyChart Message (if you have MyChart) OR A paper copy in the mail If you have any lab test that is abnormal or we need to change your treatment, we will call you to review the results.    Testing/Procedures:  Your physician has recommended that you have a PSG sleep study. This test records several body functions during sleep, including: brain activity, eye movement, oxygen and carbon dioxide blood levels, heart rate and rhythm, breathing rate and rhythm, the flow of air through your mouth and nose, snoring, body muscle movements, and chest and belly movement.  OUR SLEEP STUDY COORDINATOR NINA JONES  WILL BE IN CONTACT WITH YOU SOON TO HELP COORDINATE WITH THE SLEEP CENTER IN GETTING YOUR IN LAB SLEEP STUDY DONE     Your cardiac CT will be scheduled at one of the below locations:   [facility] [address] [place], [place] [address] [phone]   If scheduled at [facility], please arrive at the [facility] and Children's Entrance (Entrance C2) of [facility] 30 minutes prior to test start time. You can use the FREE valet parking offered at entrance C (encouraged to control the heart rate for the test)  Proceed to the [facility] Radiology Department (first floor) to check-in and test prep.  All radiology patients and guests should use entrance C2 at [facility], accessed from [facility], even though the hospital's  physical address listed is [address]     There is spacious parking and easy access to the radiology department from the [facility] Heart and Vascular entrance. Please enter here and check-in with the desk attendant.   Please follow these instructions carefully (unless otherwise directed):  An IV will be required for this test and Nitroglycerin will be given.  Hold all erectile dysfunction medications at least 3 days (72 hrs) prior to test. (Ie viagra, cialis, sildenafil, tadalafil, etc)   On the Night Before the Test: Be sure to Drink plenty of water. Do not consume any caffeinated/decaffeinated beverages or chocolate 12 hours prior to your test. Do not take any antihistamines 12 hours prior to your test.   On the Day of the Test: Drink plenty of water until 1 hour prior to the test. Do not eat any food 1 hour prior to test. You may take your regular medications prior to the test.   FEMALES- please wear underwire-free bra if available, avoid dresses & tight clothing       After the Test: Drink plenty of water. After receiving IV contrast, you may experience a mild flushed feeling. This is normal. On occasion, you may experience a mild rash up to 24 hours after the test. This is not dangerous. If this occurs, you can take Benadryl 25 mg and increase your fluid intake. If you experience trouble breathing, this can be serious. If it is severe call 911 IMMEDIATELY. If it is mild, please call our office.   We will call  to schedule your test 2-4 weeks out understanding that some insurance companies will need an authorization prior to the service being performed.   For more information and frequently asked questions, please visit our website : [email or web address]  For non-scheduling related questions, please contact the cardiac imaging nurse navigator should you have any questions/concerns: Cardiac Imaging Nurse Navigators Direct Office Dial: [phone]    For scheduling needs, including cancellations and rescheduling, please call [place], [phone].     Follow-Up: At [facility], you and your health needs are our priority.  As part of our continuing mission to provide you with exceptional heart care, we have created designated Provider Care Teams.  These Care Teams include your primary Cardiologist (physician) and Advanced Practice Providers (APPs -  Physician Assistants and Nurse Practitioners) who all work together to provide you with the care you need, when you need it.  We recommend signing up for the patient portal called "MyChart".  Sign up information is provided on this After Visit Summary.  MyChart is used to connect with patients for Virtual Visits (Telemedicine).  Patients are able to view lab/test results, encounter notes, upcoming appointments, etc.  Non-urgent messages can be sent to your provider as well.   To learn more about what you can do with MyChart, go to [email or web address].    Your next appointment:   1 year(s)  Provider:   DR. Armanda Magic

## 2023-08-09 NOTE — Progress Notes (Signed)
Cardiology Office Note:    Date:  08/09/2023   ID:  Elizabeth, Russell 25-Jan-1941, MRN [id number]  PCP:  [name], MD   [facility]  Cardiologist: Armanda Magic, MD  Advanced Practice Provider:  No care team member to display Electrophysiologist:  None   Referring MD: [name], MD     History of Present Illness:    Elizabeth Russell is a 82 y.o. female with a hx of HTN, DMII, GERD, CKD stage 3, and anxiety who presents to clinic for follow-up.   Patient presented to [facility] on 11/18/19 with chest pain that began the night prior after eating. In ED, trop negative x2, ECG with NSR and no ischemic changes, She was given an antiacid with improvement and referred back to her PCP for further management. At her PCPs office, she was complaining of intermittent episodes of lightheadedness and hypotension for which she was initially referred to Cardiology clinic.  During  visit  11/26/20, the patient was having episodes of lightheadedness and shortness of breath while standing. Had some associated hypotension during these periods as well. No LOC. Due to concern for orthostatic hypotension and decreased her losartan to 25mg  daily, recommended compression socks, and increased fluid intake.  Saw Gillian Shields in 10/17/21 after going to the ER on 10/07/2021 where she was having indigestion symptoms found to be in intermittent Afib. She was discharged on apixaban and metop 25mg  BID. TTE 10/2021 with LVEF 65-70%, G2DD, mild MR, mildly dilated aorta 39mm.   Seen in clinic on 08/2022 where she was having some memory issues. Trialed off pravastatin to see if that helped and she felt better. Changed to zetia instead.  She is here today for followup and is doing well.  She tells me that she has been having discomfort in her chest that she thinks feels like indigestion.  Sometimes if she is sitting she will feel SOB. The chest discomfort recently started and is nonexertional.  There are  no associated sx of nausea, diaphoresis or vomiting.  Occasionally she will feel SOB with the discomfort.  The symptoms usually last around 45 minutes and resolve on their own.  She denies any PND, orthopnea, LE edema, palpitations or syncope.  She has some dizziness when turning her head at times. She is compliant with her meds and is tolerating meds with no SE.  She has never smoked and has no family hx of CAD.    She also complains of sleep maintenance insomnia.  She has no problems going to sleep at night but wakes up int he middle of the night and cannot go back to sleep.  Her husband says that she snores some but sleep talks a lot a night and has been combative during sleep in the past.  She says that she was diagnosed with a sleep disorder.  Past Medical History:  Diagnosis Date   CKD (chronic kidney disease) stage 3, GFR 30-59 ml/min (HCC)    GERD (gastroesophageal reflux disease)    Hypertension    PAF (paroxysmal atrial fibrillation) (HCC)    Pre-diabetes     No past surgical history on file.  Current Medications: Current Meds  Medication Sig   acetaminophen (TYLENOL) 500 MG tablet Take 500-1,000 mg by mouth every 6 (six) hours as needed for mild pain or headache.   apixaban (ELIQUIS) 5 MG TABS tablet Take 1 tablet (5 mg total) by mouth 2 (two) times daily.   COVID-19 mRNA bivalent vaccine, Pfizer, (  PFIZER COVID-19 VAC BIVALENT) injection Inject into the muscle.   ezetimibe (ZETIA) 10 MG tablet Take 1 tablet (10 mg total) by mouth daily.   losartan (COZAAR) 25 MG tablet Take 1 tablet (25 mg total) by mouth in the morning, then take 1/2 tablet (12.5 mg total) by mouth in the evening, everyday.   metoprolol tartrate (LOPRESSOR) 25 MG tablet Take 0.5 tablets (12.5 mg total) by mouth 2 (two) times daily. May take an extra 1/2 dose as needed if HR>100 bpm.     Allergies:   Patient has no known allergies.   Social History   Socioeconomic History   Marital status: Married    Spouse  name: Elizabeth Russell   Number of children: 3   Years of education: Not on file   Highest education level: High school graduate  Occupational History   Not on file  Tobacco Use   Smoking status: Never   Smokeless tobacco: Never  Vaping Use   Vaping status: Never Used  Substance and Sexual Activity   Alcohol use: No   Drug use: No   Sexual activity: Not on file  Other Topics Concern   Not on file  Social History Narrative   Lives at home with her husband   Right handed   Caffeine: 1 c coffee a day    Social Determinants of Corporate investment banker Strain: Not on file  Food Insecurity: Not on file  Transportation Needs: Not on file  Physical Activity: Not on file  Stress: Not on file  Social Connections: Not on file     Family History: The patient's family history includes Heart Problems in her father; High blood pressure in her mother.  ROS:   Please see the history of present illness.      EKGs/Labs/Other Studies Reviewed:    The following studies were reviewed today:  TTE 10/2021: IMPRESSIONS   1. Left ventricular ejection fraction, by estimation, is 65 to 70%. Left  ventricular ejection fraction by 3D volume is 70 %. The left ventricle has  normal function. The left ventricle has no regional wall motion  abnormalities. There is moderate  asymmetric left ventricular hypertrophy of the basal-septal segment. Left  ventricular diastolic parameters are consistent with Grade II diastolic  dysfunction (pseudonormalization).   2. Right ventricular systolic function is normal. The right ventricular  size is normal. There is normal pulmonary artery systolic pressure. The  estimated right ventricular systolic pressure is 27.8 mmHg.   3. Left atrial size was mildly dilated.   4. The mitral valve is normal in structure. Mild mitral valve  regurgitation.   5. The aortic valve has an indeterminant number of cusps. There is mild  thickening of the aortic valve. Aortic valve  regurgitation is trivial.  Aortic valve sclerosis is present, with no evidence of aortic valve  stenosis.   6. Aortic dilatation noted. There is mild dilatation of the ascending  aorta, measuring 39 mm.   7. The inferior vena cava is normal in size with greater than 50%  respiratory variability, suggesting right atrial pressure of 3 mmHg.    Recent Labs: No results found for requested labs within last 365 days.  Recent Lipid Panel No results found for: "CHOL", "TRIG", "HDL", "CHOLHDL", "VLDL", "LDLCALC", "LDLDIRECT"   Risk Assessment/Calculations:       Physical Exam:    VS:  BP 120/82   Pulse (!) 53   Ht 5\' 4"  (1.626 m)   Wt 135 lb 3.2 oz (  61.3 kg)   SpO2 98%   BMI 23.21 kg/m     Wt Readings from Last 3 Encounters:  08/09/23 135 lb 3.2 oz (61.3 kg)  02/19/23 140 lb (63.5 kg)  08/17/22 149 lb 6.4 oz (67.8 kg)     GEN: Well nourished, well developed in no acute distress HEENT: Normal NECK: No JVD; No carotid bruits LYMPHATICS: No lymphadenopathy CARDIAC:RRR, no murmurs, rubs, gallops RESPIRATORY:  Clear to auscultation without rales, wheezing or rhonchi  ABDOMEN: Soft, non-tender, non-distended MUSCULOSKELETAL:  No edema; No deformity  SKIN: Warm and dry NEUROLOGIC:  Alert and oriented x 3 PSYCHIATRIC:  Normal affect   ASSESSMENT:    1. Paroxysmal atrial fibrillation (HCC)   2. Primary hypertension   3. Orthostatic hypotension   4. Pure hypercholesterolemia   5. Type 2 diabetes mellitus with diabetic nephropathy, with long-term current use of insulin (HCC)   6. Stage 3a chronic kidney disease (HCC)   7. Other insomnia   8. Chest discomfort        PLAN:    In order of problems listed above:  #Paroxysmal Atrial Fibrillation: -CHADs-vasc 5. Diagnosed in 09/2021 when she presented to the ER with indigestion symptoms. Was discharged on apixaban and metoprolol. -She remains in normal sinus rhythm and denies any palpitations -She denies any bleeding  problems on DOAC -Continue prescription drug management with apixaban 5 mg twice daily and lopressor 12.5 mg twice daily with as needed refills  -TTE with normal BiV function   #Orthostatic Hypotension: Resolved. -She has not had any more dizzy spells or syncope -Continue compression socks -Continue fluid intake of at least 64 ounces of fluid a day   #HTN: BP well-controlled on exam today -Continue losartan 25 mg every morning and 12.5 mg every afternoon with as needed refills -Continue Lopressor 12.5 mg twice daily with as needed refills   #HLD: #Memory Issues: Had memory issues with statins in the past and memory improved with stopping pravastatin. Now on zetia 10mg  daily. -Continue Zetia 10 mg daily with as needed refills -Check FLP and ALT   #Sleep Maintenance Insomnia -she falls asleep around 11pm and goes to sleep right away but then in the middle of the night and cannot get back to sleep.  She sleeps a lot during the day in the chair according to her husband -she was seen by a Sleep Medicine MD in the past but never did a sleep study -her husband describes episodes that sound like REM Sleep behavior disorder -I will get a PSG to rule out OSA and REM sleep disorder  #Chest Discomfort -her sx are hard to describe but are somewhat atypical in that it is nonexertional with no other sx except for a feeling she cannot take a deep breath -I will get a coronary CTA to define coronary anatomy  Follow-up:1 year  Medication Adjustments/Labs and Tests Ordered: Current medicines are reviewed at length with the patient today.  Concerns regarding medicines are outlined above.  No orders of the defined types were placed in this encounter.  No orders of the defined types were placed in this encounter.  There are no Patient Instructions on file for this visit.     Signed, Armanda Magic, MD  08/09/2023 2:10 PM    [facility]

## 2023-08-10 ENCOUNTER — Other Ambulatory Visit: Payer: Self-pay

## 2023-08-10 MED ORDER — EZETIMIBE 10 MG PO TABS: 10.0000 mg | ORAL_TABLET | Freq: Every day | ORAL | 11 refills | Status: AC

## 2023-08-13 ENCOUNTER — Ambulatory Visit: Payer: Medicare Other

## 2023-08-13 ENCOUNTER — Telehealth: Payer: Self-pay

## 2023-08-13 NOTE — Telephone Encounter (Signed)
-----   Message from [place] C sent at 08/13/2023 11:36 AM EDT ----- Regarding: RE: cardiac ct per Dr. Mayford Knife Pt was contacted to scheduled cardiac ct - per notes Pt refused to schedule cta will contact MD about not doing it. ----- Message ----- From: [name], LPN Sent: 1/61/0960   2:22 PM EDT To: Florina Ou; Lennie Odor, RN; # Subject: cardiac ct per Dr. Mayford Knife                      Dr. Mayford Knife just saw this pt in clinic and ordered for her to get a Cardiac CT done for chest pain  Order is in.  Will be getting her labs this week when we check her fasting lipids (was not fasting today)  Please call and schedule and shoot Rexene Edison RN with Dr. Mayford Knife the date for her follow-up?  Thanks [employer]

## 2023-08-13 NOTE — Telephone Encounter (Signed)
Call to patient who states she got confused and didn't realize staff was calling to schedule CTA, she thought they were trying to schedule her sleep study. Provided extensive information as to why CTA was ordered and how to prepare. Patient states she would like to talk to her spouse about proceeding and will call our office back.

## 2023-08-17 ENCOUNTER — Telehealth: Payer: Self-pay

## 2023-08-17 ENCOUNTER — Ambulatory Visit: Payer: Medicare Other | Attending: Cardiology

## 2023-08-17 DIAGNOSIS — I10 Essential (primary) hypertension: Secondary | ICD-10-CM | POA: Diagnosis not present

## 2023-08-17 DIAGNOSIS — I480 Paroxysmal atrial fibrillation: Secondary | ICD-10-CM

## 2023-08-17 DIAGNOSIS — G4709 Other insomnia: Secondary | ICD-10-CM

## 2023-08-17 DIAGNOSIS — R0789 Other chest pain: Secondary | ICD-10-CM

## 2023-08-17 DIAGNOSIS — N1831 Chronic kidney disease, stage 3a: Secondary | ICD-10-CM

## 2023-08-17 DIAGNOSIS — E1121 Type 2 diabetes mellitus with diabetic nephropathy: Secondary | ICD-10-CM | POA: Diagnosis not present

## 2023-08-17 DIAGNOSIS — Z794 Long term (current) use of insulin: Secondary | ICD-10-CM

## 2023-08-17 DIAGNOSIS — E7800 Pure hypercholesterolemia, unspecified: Secondary | ICD-10-CM | POA: Diagnosis not present

## 2023-08-17 DIAGNOSIS — I951 Orthostatic hypotension: Secondary | ICD-10-CM | POA: Diagnosis not present

## 2023-08-17 DIAGNOSIS — R072 Precordial pain: Secondary | ICD-10-CM

## 2023-08-17 NOTE — Telephone Encounter (Signed)
**Note De-Identified Cinthia Rodden Obfuscation** Ordering provider: Dr Mayford Knife Associated diagnoses: Insomnia-G47.00 and REM Sleep Disorder-G47.00 PSG Sleep Study PA obtained on 08/17/2023 by Briella Hobday, [name], LPN. Decision ID #: [id number] PA Required per [facility] Provider Portal.   Phone note routed to covering staff as FYI and for follow-up.

## 2023-08-18 ENCOUNTER — Ambulatory Visit: Payer: Medicare Other | Admitting: Cardiology

## 2023-08-18 LAB — BASIC METABOLIC PANEL
BUN/Creatinine Ratio: 9 — ABNORMAL LOW (ref 12–28)
BUN: 11 mg/dL (ref 8–27)
CO2: 24 mmol/L (ref 20–29)
Calcium: 9.5 mg/dL (ref 8.7–10.3)
Chloride: 104 mmol/L (ref 96–106)
Creatinine, Ser: 1.17 mg/dL — ABNORMAL HIGH (ref 0.57–1.00)
Glucose: 94 mg/dL (ref 70–99)
Potassium: 4.4 mmol/L (ref 3.5–5.2)
Sodium: 142 mmol/L (ref 134–144)
eGFR: 47 mL/min/{1.73_m2} — ABNORMAL LOW (ref >59)

## 2023-08-18 LAB — LIPID PANEL
Chol/HDL Ratio: 2.8 {ratio} (ref 0.0–4.4)
Cholesterol, Total: 196 mg/dL (ref 100–199)
HDL: 71 mg/dL (ref >39)
LDL Chol Calc (NIH): 111 mg/dL — ABNORMAL HIGH (ref 0–99)
Triglycerides: 75 mg/dL (ref 0–149)
VLDL Cholesterol Cal: 14 mg/dL (ref 5–40)

## 2023-08-18 LAB — ALT: ALT: 13 [IU]/L (ref 0–32)

## 2023-08-19 ENCOUNTER — Telehealth: Payer: Self-pay

## 2023-08-19 DIAGNOSIS — E7800 Pure hypercholesterolemia, unspecified: Secondary | ICD-10-CM

## 2023-08-19 MED ORDER — METOPROLOL TARTRATE 25 MG PO TABS: 25.0000 mg | ORAL_TABLET | Freq: Once | ORAL | 0 refills | Status: DC

## 2023-08-19 NOTE — Telephone Encounter (Signed)
-----   Message from Nurse Corky Crafts sent at 08/19/2023  8:13 AM EDT -----  ----- Message ----- From: Quintella Reichert, MD Sent: 08/18/2023   8:43 PM EDT To: L[facility] Muse [facility] Triage  LDL is not at goal - we really need the coronary CTA so we can determine how low her LDL needs to get

## 2023-08-19 NOTE — Telephone Encounter (Signed)
Call to patient to advise that LDL is not at goal, coronary CTA ordered so Dr. Mayford Knife can determine how low her LDL needs to get. Instructions sent via Mychart, orders placed.

## 2023-08-30 ENCOUNTER — Telehealth: Payer: Self-pay | Admitting: Cardiology

## 2023-08-30 NOTE — Telephone Encounter (Signed)
  Pt is requesting to switch from Dr. Mayford Knife to Dr. Clifton James. She said, her pcp and her friends recommended Dr. Clifton James

## 2023-09-06 ENCOUNTER — Other Ambulatory Visit: Payer: Self-pay

## 2023-09-06 MED ORDER — METOPROLOL TARTRATE 25 MG PO TABS: 12.5000 mg | ORAL_TABLET | Freq: Two times a day (BID) | ORAL | 3 refills | Status: DC

## 2023-09-06 MED ORDER — APIXABAN 5 MG PO TABS: 5.0000 mg | ORAL_TABLET | Freq: Two times a day (BID) | ORAL | 11 refills | Status: DC

## 2023-09-06 NOTE — Telephone Encounter (Signed)
Prescription refill request for Eliquis received. Indication:afib Last office visit:9/24 Scr:1.17  10/24 Age: 82 Weight:61.3  kg  Prescription refilled

## 2023-10-01 DIAGNOSIS — Z1231 Encounter for screening mammogram for malignant neoplasm of breast: Secondary | ICD-10-CM | POA: Diagnosis not present

## 2023-10-12 NOTE — Progress Notes (Unsigned)
No chief complaint on file.  History of Present Illness: 82 yo female with history of atrial fib, HTN, DM, GERD, CKD stage III who is here today for follow up. She has been followed in our office by Dr. Shari Prows and most recently by Dr. Mayford Knife. She was evaluated in 2021 for chest pain that was felt to be GI related. She had dizziness with standing in 2022 and her Losartan was decreased. She was found to be in atrial fib in December 2022. She was started on Eliquis and metoprolol. Echo in December 2022 with LVEF=65-70%, mild mitral regurgitation. Statin stopped in October 2023 due to memory issues. She was changed to Zetia. She was seen by Dr. Mayford Knife in September 2024 and described chest pain at rest. Coronary CTA was ordered but not completed by the patient. ? Sleep study.   She is here today for follow up. The patient denies any chest pain, dyspnea, palpitations, lower extremity edema, orthopnea, PND, dizziness, near syncope or syncope.   Primary Care Physician: [name], MD   Past Medical History:  Diagnosis Date   CKD (chronic kidney disease) stage 3, GFR 30-59 ml/min (HCC)    GERD (gastroesophageal reflux disease)    Hypertension    PAF (paroxysmal atrial fibrillation) (HCC)    Pre-diabetes     No past surgical history on file.  Current Outpatient Medications  Medication Sig Dispense Refill   acetaminophen (TYLENOL) 500 MG tablet Take 500-1,000 mg by mouth every 6 (six) hours as needed for mild pain or headache.     apixaban (ELIQUIS) 5 MG TABS tablet Take 1 tablet (5 mg total) by mouth 2 (two) times daily. 60 tablet 11   COVID-19 mRNA bivalent vaccine, Pfizer, (PFIZER COVID-19 VAC BIVALENT) injection Inject into the muscle. 0.3 mL 0   ezetimibe (ZETIA) 10 MG tablet Take 1 tablet (10 mg total) by mouth daily. 30 tablet 11   losartan (COZAAR) 25 MG tablet Take 1 tablet (25 mg total) by mouth in the morning, then take 1/2 tablet (12.5 mg total) by mouth in the evening,  everyday. 135 tablet 2   melatonin 3 MG TABS tablet Take 1 tablet (3 mg total) by mouth at bedtime. (Patient not taking: Reported on 08/09/2023) 30 tablet 0   metoprolol tartrate (LOPRESSOR) 25 MG tablet Take 0.5 tablets (12.5 mg total) by mouth 2 (two) times daily. May take an extra 1/2 dose as needed if HR>100 bpm. 135 tablet 3   pravastatin (PRAVACHOL) 80 MG tablet Take 1 tablet (80 mg total) by mouth daily. (Patient not taking: Reported on 02/19/2023) 90 tablet 2   traZODone (DESYREL) 50 MG tablet Take 0.5 tablets (25 mg total) by mouth at bedtime. (Patient not taking: Reported on 08/09/2023) 45 tablet 1   No current facility-administered medications for this visit.    No Known Allergies  Social History   Socioeconomic History   Marital status: Married    Spouse name: Fayrene Fearing   Number of children: 3   Years of education: Not on file   Highest education level: High school graduate  Occupational History   Not on file  Tobacco Use   Smoking status: Never   Smokeless tobacco: Never  Vaping Use   Vaping status: Never Used  Substance and Sexual Activity   Alcohol use: No   Drug use: No   Sexual activity: Not on file  Other Topics Concern   Not on file  Social History Narrative   Lives at home with  her husband   Right handed   Caffeine: 1 c coffee a day    Social Determinants of Corporate investment banker Strain: Not on file  Food Insecurity: Not on file  Transportation Needs: Not on file  Physical Activity: Not on file  Stress: Not on file  Social Connections: Not on file  Intimate Partner Violence: Not on file    Family History  Problem Relation Age of Onset   High blood pressure Mother    Heart Problems Father     Review of Systems:  As stated in the HPI and otherwise negative.   There were no vitals taken for this visit.  Physical Examination: General: Well developed, well nourished, NAD  HEENT: OP clear, mucus membranes moist  SKIN: warm, dry. No  rashes. Neuro: No focal deficits  Musculoskeletal: Muscle strength 5/5 all ext  Psychiatric: Mood and affect normal  Neck: No JVD, no carotid bruits, no thyromegaly, no lymphadenopathy.  Lungs:Clear bilaterally, no wheezes, rhonci, crackles Cardiovascular: Regular rate and rhythm. No murmurs, gallops or rubs. Abdomen:Soft. Bowel sounds present. Non-tender.  Extremities: No lower extremity edema. Pulses are 2 + in the bilateral DP/PT.  EKG:  EKG {ACTION; IS/IS [id number]} ordered today. The ekg ordered today demonstrates ***  Recent Labs: 08/17/2023: ALT 13; BUN 11; Creatinine, Ser 1.17; Potassium 4.4; Sodium 142   Lipid Panel    Component Value Date/Time   CHOL 196 08/17/2023 1126   TRIG 75 08/17/2023 1126   HDL 71 08/17/2023 1126   CHOLHDL 2.8 08/17/2023 1126   LDLCALC 111 (H) 08/17/2023 1126     Wt Readings from Last 3 Encounters:  08/09/23 61.3 kg  02/19/23 63.5 kg  08/17/22 67.8 kg      Assessment and Plan:   1. Paroxysmal atrial fibrillation: Sinus today. Continue Eliquis and metoprolol.   2. Chest pain: *** ? Coronary CTA not completed  3. Sleep apnea: ? Sleep study pending  Labs/ tests ordered today include:  No orders of the defined types were placed in this encounter.    Disposition:   F/U with me in ***    Signed, Verne Carrow, MD, [facility] 10/12/2023 1:35 PM    [facility] [address] [place], [place], [place]  [address] Phone: [phone]; Fax: [phone]

## 2023-10-13 ENCOUNTER — Encounter: Payer: Self-pay | Admitting: Cardiovascular Disease

## 2023-10-13 ENCOUNTER — Ambulatory Visit: Payer: Medicare Other | Attending: Cardiovascular Disease | Admitting: Cardiovascular Disease

## 2023-10-13 VITALS — BP 110/62 | HR 52 | Ht 64.0 in | Wt 130.2 lb

## 2023-10-13 DIAGNOSIS — I480 Paroxysmal atrial fibrillation: Secondary | ICD-10-CM

## 2023-10-13 DIAGNOSIS — R400 Somnolence: Secondary | ICD-10-CM

## 2023-10-13 NOTE — Patient Instructions (Signed)
Medication Instructions:  No changes *If you need a refill on your cardiac medications before your next appointment, please call your pharmacy*   Lab Work: none   Testing/Procedures: We will reach out to you to schedule your sleep study   Follow-Up: At [facility], you and your health needs are our priority.  As part of our continuing mission to provide you with exceptional heart care, we have created designated Provider Care Teams.  These Care Teams include your primary Cardiologist (physician) and Advanced Practice Providers (APPs -  Physician Assistants and Nurse Practitioners) who all work together to provide you with the care you need, when you need it.   Your next appointment:   12 month(s)  Provider:   Verne Carrow, MD

## 2023-10-25 ENCOUNTER — Other Ambulatory Visit: Payer: Self-pay

## 2023-10-25 DIAGNOSIS — G4733 Obstructive sleep apnea (adult) (pediatric): Secondary | ICD-10-CM

## 2023-11-05 NOTE — Telephone Encounter (Signed)
12/20 Notification or Prior Authorization is not required for the requested services. PER KELLY C.PT IS AUTHORIZED Decision ID #: [id number].

## 2023-12-23 DIAGNOSIS — I129 Hypertensive chronic kidney disease with stage 1 through stage 4 chronic kidney disease, or unspecified chronic kidney disease: Secondary | ICD-10-CM | POA: Diagnosis not present

## 2023-12-23 DIAGNOSIS — R634 Abnormal weight loss: Secondary | ICD-10-CM | POA: Diagnosis not present

## 2023-12-23 DIAGNOSIS — Z23 Encounter for immunization: Secondary | ICD-10-CM | POA: Diagnosis not present

## 2023-12-23 DIAGNOSIS — I480 Paroxysmal atrial fibrillation: Secondary | ICD-10-CM | POA: Diagnosis not present

## 2023-12-23 DIAGNOSIS — E785 Hyperlipidemia, unspecified: Secondary | ICD-10-CM | POA: Diagnosis not present

## 2023-12-23 DIAGNOSIS — E1121 Type 2 diabetes mellitus with diabetic nephropathy: Secondary | ICD-10-CM | POA: Diagnosis not present

## 2023-12-23 DIAGNOSIS — D6869 Other thrombophilia: Secondary | ICD-10-CM | POA: Diagnosis not present

## 2023-12-23 DIAGNOSIS — N183 Chronic kidney disease, stage 3 unspecified: Secondary | ICD-10-CM | POA: Diagnosis not present

## 2023-12-23 DIAGNOSIS — G3184 Mild cognitive impairment, so stated: Secondary | ICD-10-CM | POA: Diagnosis not present

## 2023-12-27 DIAGNOSIS — E1121 Type 2 diabetes mellitus with diabetic nephropathy: Secondary | ICD-10-CM | POA: Diagnosis not present

## 2024-01-20 DIAGNOSIS — H40013 Open angle with borderline findings, low risk, bilateral: Secondary | ICD-10-CM | POA: Diagnosis not present

## 2024-01-20 DIAGNOSIS — H26491 Other secondary cataract, right eye: Secondary | ICD-10-CM | POA: Diagnosis not present

## 2024-01-20 DIAGNOSIS — H04123 Dry eye syndrome of bilateral lacrimal glands: Secondary | ICD-10-CM | POA: Diagnosis not present

## 2024-04-13 ENCOUNTER — Telehealth: Payer: Self-pay | Admitting: Neurology

## 2024-04-13 ENCOUNTER — Ambulatory Visit: Payer: Medicare Other | Admitting: Neurology

## 2024-04-13 ENCOUNTER — Encounter: Payer: Self-pay | Admitting: Neurology

## 2024-04-13 VITALS — BP 129/68 | HR 75 | Ht 64.0 in | Wt 129.0 lb

## 2024-04-13 DIAGNOSIS — F515 Nightmare disorder: Secondary | ICD-10-CM | POA: Insufficient documentation

## 2024-04-13 DIAGNOSIS — I480 Paroxysmal atrial fibrillation: Secondary | ICD-10-CM

## 2024-04-13 DIAGNOSIS — R419 Unspecified symptoms and signs involving cognitive functions and awareness: Secondary | ICD-10-CM

## 2024-04-13 NOTE — Addendum Note (Signed)
 Addended by: Neomia Banner on: 04/13/2024 05:28 PM   Modules accepted: Level of Service

## 2024-04-13 NOTE — Progress Notes (Addendum)
 SLEEP MEDICINE CLINIC    Provider:  Neomia Banner, MD  Primary Care Physician:  Elizabeth Grave, MD [phone] [address] [place] [address]     Referring Provider: Victorio Grave, Md [phone] [place],  [place] [address]          Chief Complaint according to patient   Patient presents with:     New Patient (Initial Visit)           HISTORY OF PRESENT ILLNESS:  Elizabeth Russell is a 83 y.o. female patient who is seen upon PCP ( Dr Elizabeth Russell)  referral on 04/13/2024  for a memory evaluation, not further classified- this patient reported that a memory concern was known for 2-3 years and she had some test in the PCP's office.   She is here today with Elizabeth Russell, her husband.   Chief concern according to patient :  " I can't remember names"    I am seeing Elizabeth Russell on 04/13/24,  a right -handed married, retired , AA female with a memory disorder.  She endorsed independence in all aspects of daily living, ( ADL questionnaire )   and her husband agreed : She cooks, she drives, she plans their days and handles the finances.    Family medical /sleep history: Her parents never developed dementia, and none of her 8 siblings.     Social history: This  patient has a high school education, she quoted her date of graduation 04-02-1959- Patient was employed at [employer] in Clinical biochemist,  retired from there age 25,  never worked night shifts, never was exposed to chemicals, toxins, dust or smoke.  She is married and lives in a household with spouse,no pets . The couple has 3 sons, 4 grandchildren.   Tobacco use; none.  ETOH use ; none,  Caffeine intake in form of Coffee(  1 cup in AM ) Soda( /) Tea ( /) and no energy drinks Exercise in form of walking daily.       Sleep habits are as follows: The patient's dinner time is between 3-4 PM. The patient goes to bed at 10 PM and continues to sleep for 7-8 hours, mostly wakes up refreshed. She reports that they eat 2 meals  a day.      Review of Systems: Out of a complete 14 system review, the patient complains of only the following symptoms, and all other reviewed systems are negative.:   ADL all independent/   "Facial recognition"  impaired.  It was difficult to elicit if she couldn't remember faces or names - she used these terms interchangeably.   I like to add a copy of her MOCA because the results were unusual. I asked my staff to scan the original  paper into the media tap and attach.         04/13/2024    2:05 PM  Montreal Cognitive Assessment   Visuospatial/ Executive (0/5) 2  Naming (0/3) 0  Attention: Read list of digits (0/2) 1  Attention: Read list of letters (0/1) 1  Attention: Serial 7 subtraction starting at 100 (0/3) 1  Language: Repeat phrase (0/2) 2  Language : Fluency (0/1) 1  Abstraction (0/2) 0  Delayed Recall (0/5) 0  Orientation (0/6) 5  Total 13       Social History   Socioeconomic History   Marital status: Married    Spouse name: Elizabeth Russell   Number of children: 3  Years of education: Not on file   Highest education level: High school graduate  Occupational History   Not on file  Tobacco Use   Smoking status: Never   Smokeless tobacco: Never  Vaping Use   Vaping status: Never Used  Substance and Sexual Activity   Alcohol use: No   Drug use: No   Sexual activity: Not on file  Other Topics Concern   Not on file  Social History Narrative   Lives at home with her husband   Right handed   Caffeine: 1 c coffee a day    Social Drivers of Corporate investment banker Strain: Not on file  Food Insecurity: Not on file  Transportation Needs: Not on file  Physical Activity: Not on file  Stress: Not on file  Social Connections: Not on file    Family History  Problem Relation Age of Onset   High blood pressure Mother    Heart Problems Father     Past Medical History:  Diagnosis Date   CKD (chronic kidney disease) stage 3, GFR 30-59 ml/min (HCC)    GERD  (gastroesophageal reflux disease)    Hypertension    PAF (paroxysmal atrial fibrillation) (HCC) 2022, chronically anticoagulated.     Pre-diabetes.  Refused work up for sleep disorder in 2023     History reviewed. No pertinent surgical history.   Current Outpatient Medications on File Prior to Visit  Medication Sig Dispense Refill   apixaban  (ELIQUIS ) 5 MG TABS tablet Take 1 tablet (5 mg total) by mouth 2 (two) times daily. 60 tablet 11   ezetimibe  (ZETIA ) 10 MG tablet Take 1 tablet (10 mg total) by mouth daily. 30 tablet 11   losartan  (COZAAR ) 25 MG tablet Take 1 tablet (25 mg total) by mouth in the morning, then take 1/2 tablet (12.5 mg total) by mouth in the evening, everyday. 135 tablet 2   metoprolol  tartrate (LOPRESSOR ) 25 MG tablet Take 0.5 tablets (12.5 mg total) by mouth 2 (two) times daily. May take an extra 1/2 dose as needed if HR>100 bpm. 135 tablet 3   No current facility-administered medications on file prior to visit.     DIAGNOSTIC DATA (LABS, IMAGING, TESTING) - I reviewed patient records, labs, notes, testing and imaging myself where available.  Lab Results  Component Value Date   WBC 6.2 10/07/2021   HGB 13.4 10/07/2021   HCT 39.3 10/07/2021   MCV 84.2 10/07/2021   PLT 216 10/07/2021      Component Value Date/Time   NA 142 08/17/2023 1126   K 4.4 08/17/2023 1126   CL 104 08/17/2023 1126   CO2 24 08/17/2023 1126   GLUCOSE 94 08/17/2023 1126   GLUCOSE 152 (H) 10/07/2021 1750   BUN 11 08/17/2023 1126   CREATININE 1.17 (H) 08/17/2023 1126   CALCIUM 9.5 08/17/2023 1126   PROT 5.8 (L) 10/07/2021 1750   ALBUMIN 3.5 10/07/2021 1750   AST 23 10/07/2021 1750   ALT 13 08/17/2023 1126   ALKPHOS 58 10/07/2021 1750   BILITOT 0.6 10/07/2021 1750   GFRNONAA 41 (L) 10/07/2021 1750   GFRAA 54 (L) 11/17/2019 2129   Lab Results  Component Value Date   CHOL 196 08/17/2023   HDL 71 08/17/2023   LDLCALC 111 (H) 08/17/2023   TRIG 75 08/17/2023   CHOLHDL 2.8  08/17/2023   No results found for: "HGBA1C" No results found for: "VITAMINB12" Lab Results  Component Value Date   TSH 1.529 10/07/2021  PHYSICAL EXAM:  Today's Vitals   04/13/24 1403  BP: 129/68  Pulse: 75  Weight: 129 lb (58.5 kg)  Height: 5\' 4"  (1.626 m)   Body mass index is 22.14 kg/m.   Wt Readings from Last 3 Encounters:  04/13/24 129 lb (58.5 kg)  10/13/23 130 lb 3.2 oz (59.1 kg)  08/09/23 135 lb 3.2 oz (61.3 kg)     Ht Readings from Last 3 Encounters:  04/13/24 5\' 4"  (1.626 m)  10/13/23 5\' 4"  (1.626 m)  08/09/23 5\' 4"  (1.626 m)      General: The patient is awake, alert and appears not in acute distress. The patient is well groomed. She sits erect, she makes eye contact , she is very composed.  Head: Normocephalic, atraumatic. Neck is supple.  Mallampati 2,  neck circumference:13 inches . Nasal airflow patent.  Retrognathia is not seen.  Dental status: biological  Cardiovascular:  Regular rate and cardiac rhythm by pulse,  without distended neck veins. Respiratory: Lungs are clear .  Skin:  Without evidence of ankle edema, or rash. Trunk: The patient's posture is erect.   NEUROLOGIC EXAM: The patient is awake and alert, oriented to place and time.   Memory subjective described as intact.  Attention span & concentration ability appears normal.  Speech is fluent,  without  dysarthria, dysphonia or aphasia.  Mood and affect are appropriate. She appears entirely unconcerned.   Cranial nerves: no loss of smell or taste reported  Pupils are equal and briskly reactive to light. Funduscopic exam deferred.  Extraocular movements in vertical and horizontal planes were intact and without nystagmus. No Diplopia. Visual fields by finger perimetry are intact. Normal blink reflex.  Hearing was intact to soft voice .    Facial sensation intact to fine touch.  Facial motor strength is symmetric and tongue and uvula moved in midline. No masked face.  Neck ROM :  rotation, tilt and flexion extension were normal for age and shoulder shrug was symmetrical.    Motor exam:  Symmetric bulk, tone and ROM.   Normal tone without cog- wheeling, symmetric grip strength .   Sensory:  Fine touch and vibration were present at both ankles.  Proprioception tested in the upper extremities was normal.   Coordination: Rapid alternating movements in the fingers/hands were of normal speed.  The Finger-to-nose maneuver was intact without evidence of ataxia, with mild right sided dysmetria and action tremor on the right . No resting tremor.   Gait and station: Patient could rise unassisted from a seated position, walked without assistive device.  Stance is of normal width/ base. Deep tendon reflexes: in the upper and lower extremities are symmetric and intact.      ASSESSMENT AND PLAN:  83 y.o. year old female  here with:  " I can't remember people"   Very low MOCA score and reportedly high functioning and highly independent on ADLs - a contrasting result . 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 Only caveat- I see no parkinsonism.   She reports difficulties with facial recognition and with finding the names of persons she is familiar with.  Name finding delay is bothering her the most. Husband answered some question with delay and may be not entirely frank during our meeting:  he denies any observations of visual hallucinations of auditory hallucinations.   Unusual distribution of memory deficits over many domains,  including  visual recognition, trail making,  spatial and executive function, calculation ( math ) and poor STM / word recall. She was well oriented to place and date ! She handles finances but did not do well on serial 7.  She misnamed  all 3 animals on the MOCA test- Lion, [place], Camel and called them instead Cow, Pig and Donkey (!).  When I  pointed these 'misnomers' out to the couple, they were not at all concerned, the aloof attitude could be an additional diagnostic symptom.     1) I need the help of a neuropsychologist- there are Alzheimer's cases with capgras delusions or imposter syndrome, but nothing in our [facility] test result fits a single form of neurocognitive disorder. I was not able to elicit / find notes of any psychiatric history either.   2) I will order an MRI of the brain to look for multiinfarct injury, she has atrial fib,  is anticoagulated but still at higher risk of vascular events.  3)  Her sleep care had apparently been provided by cardiology for OSA rule out only. REM BD is strongly associated with Lewy Body and PD/  OSA testing won't help that much.  PS : The patient had cancelled 4 consecutive appointments in our sleep clinic in 2023(!!) and then did not follow up again. She is not opposed to a HST , as she is at higher risk for OSA with her history of atrial fib.  She is unwilling to have any in lab PSG testing unless her husband can be with her all  night- I have declined such an arrangement. PS : husband reports she had dream enactment in the past, not in the last 9 months. She remains unwilling to be worked up by a PSG for parasomnia.   4) I also will send ATN panel and AD risk factor panel. 5) Neuropsychologist referral.    I plan to follow up either personally or through our NP within 6 months.   We will use the alternative MOCA test version.   I would like to thank Elizabeth Grave, MD for allowing me to meet with and to take care of this unusual  case.     After spending a total time of  45  minutes face to face and additional time for physical and neurologic examination, review of laboratory studies,  personal review of imaging studies, reports and results of other testing and review of referral information / records as far as provided in visit,   Electronically signed by: Elizabeth Banner, MD  04/13/2024 2:38 PM  [facility] and [facility] Sleep Board certified by The [employer] of Sleep Medicine and Diplomate of the [employer] of Sleep Medicine. Board certified In Neurology through the ABPN, Fellow of the [employer] of Neurology.

## 2024-04-13 NOTE — Patient Instructions (Signed)
 Problems With Thinking and Memory (Mild Neurocognitive Disorder): What to Know Mild neurocognitive disorder, formerly known as mild cognitive impairment, is a disorder where your memory doesn't work as well as it should. It may also affect other mental abilities like thinking, communicating, behavior, and being able to finish tasks. These problems can be noticed and measured. But they usually don't stop you from doing daily activities or living on your own. Mild neurocognitive disorder usually happens after 83 years of age. But it can also happen at younger ages. It's not as serious as major neurocognitive disorder, also known as dementia, but it may be the first sign of it. In general, the symptoms of this condition get worse over time. In rare cases, symptoms can get better. What are the causes? This condition may be caused by: Brain disorders like Alzheimer's disease, Parkinson's disease, and other conditions that slowly damage nerve cells. Diseases that affect the blood vessels in the brain and cause small strokes. Certain infections, like HIV. Traumatic brain injury. Other medical conditions, such as brain tumors, underactive thyroid (hypothyroidism), and not having enough vitamin B12. Using certain drugs or medicines. What increases the risk? Being older than 83 years of age. Being female. Having a lower level of education. Diabetes, high blood pressure, high cholesterol, and other conditions that raise the risk for blood vessel diseases. Untreated or undertreated sleep apnea. Having a certain type of gene that can be inherited, or passed down from parent to child. Long-term health problems like heart disease, lung disease, liver disease, kidney disease, or depression. What are the signs or symptoms? Trouble remembering things. You may: Forget names, phone numbers, or details of recent events. Forget about social events and appointments. Often forget where you put your car keys or other  items. Trouble thinking and solving problems. You may have trouble with complex tasks like: Paying bills. Driving in places you don't know well. Trouble communicating. You may have trouble: Finding the right word or naming an object. Forming a sentence that makes sense. Understanding what you read or hear. Changes in your behavior or personality. When this happens, you may: Lose interest in the things you used to enjoy. Avoid being around people. Get angry more easily than usual. Act before thinking. How is this diagnosed? This condition is diagnosed based on: Your symptoms. Your health care provider may ask you and the people you spend time with, like family and friends, about your symptoms. Memory tests and other tests to check how your brain is working. Your provider may refer you to a provider called a neurologist or a mental health specialist. To try to find out the cause of your condition, your provider may: Get a detailed medical history. Ask about use of alcohol, drugs, and medicines. Do a physical exam. Order blood tests and brain imaging tests. How is this treated? Mild neurocognitive disorder that's caused by medicine use, drug use, infection, or another medical condition may get better when the cause is treated, or when medicines or drugs are stopped. If this disorder has another cause, it usually doesn't improve and may get worse. In these cases, the goal of treatment is to help you manage the symptoms. This may include: Medicines to help with memory and behavior symptoms. Talk therapy. This provides education, emotional support, memory aids, and other ways of making up for problems with mental tasks. Lifestyle changes. These may include: Getting regular exercise. Eating a healthy diet that includes omega-3 fatty acids. Doing things to challenge your thinking  and memory skills. Spending more time being with and talking to other people. Using routines like having regular  times for meals and going to bed. Follow these instructions at home: Eating and drinking  Drink more fluids as told. Eat a healthy diet that includes omega-3 fatty acids. These can be found in: Fish. Nuts. Leafy vegetables. Vegetable oils. If you drink alcohol: Limit how much you have to: 0-1 drink a day if you're female. 0-2 drinks a day if you're female. Know how much alcohol is in your drink. In the [place] one drink is one 12 oz bottle of beer (355 mL), one 5 oz glass of wine (148 mL), or one 1 oz glass of hard liquor (44 mL). Lifestyle  Get regular exercise as told by your provider. Do not smoke, vape, or use nicotine or tobacco. Use healthy ways to manage stress. If you need help managing stress, ask your provider. Keep spending time with other people. Keep your mind active by doing activities you enjoy, like reading or playing games. Make sure you get good sleep at night. These tips can help: Try not to take naps during the day. Keep your bedroom dark and cool. Do not exercise in the few hours before you go to bed. Do not have foods or drinks with caffeine at night. General instructions Take medicines only as told. Your provider may tell you to avoid taking medicines that can affect thinking. These include some medicines for pain or sleeping. Work with your provider to find out: What things you need help with. What your safety needs are. Where to find more information [employer] on Aging: [email or web address] Contact a health care provider if: You have any new symptoms. Get help right away if: You have new confusion or your confusion gets worse. You act in ways that put you or your family in danger. This information is not intended to replace advice given to you by your health care provider. Make sure you discuss any questions you have with your health care provider. Document Revised: 04/27/2023 Document Reviewed: 04/27/2023 Elsevier Patient Education  2024 [employer].

## 2024-04-13 NOTE — Telephone Encounter (Signed)
 Referral for neuropsychology fax to [facility]. Phone: [phone], Fax: [phone]

## 2024-04-14 ENCOUNTER — Telehealth: Payer: Self-pay | Admitting: Neurology

## 2024-04-14 NOTE — Telephone Encounter (Signed)
 no auth required sent to GI [phone]

## 2024-04-17 ENCOUNTER — Other Ambulatory Visit (INDEPENDENT_AMBULATORY_CARE_PROVIDER_SITE_OTHER): Payer: Self-pay

## 2024-04-17 DIAGNOSIS — Z0289 Encounter for other administrative examinations: Secondary | ICD-10-CM

## 2024-04-17 DIAGNOSIS — I480 Paroxysmal atrial fibrillation: Secondary | ICD-10-CM | POA: Diagnosis not present

## 2024-04-19 ENCOUNTER — Encounter (HOSPITAL_BASED_OUTPATIENT_CLINIC_OR_DEPARTMENT_OTHER): Admitting: Cardiology

## 2024-04-20 LAB — ATN PROFILE
A -- Beta-amyloid 42/40 Ratio: 0.096 — ABNORMAL LOW (ref >0.102)
Beta-amyloid 40: 196 pg/mL
Beta-amyloid 42: 18.83 pg/mL
N -- NfL, Plasma: 4.9 pg/mL (ref 0.00–9.13)
T -- p-tau181: 2.42 pg/mL — ABNORMAL HIGH (ref 0.00–0.97)

## 2024-04-21 ENCOUNTER — Ambulatory Visit: Payer: Self-pay | Admitting: Neurology

## 2024-04-26 NOTE — Telephone Encounter (Signed)
-----   Message from Laurita Porta sent at 04/26/2024 10:12 AM EDT ----- Letter mailed out today. ----- Message ----- From: Thena Fireman, CMA Sent: 04/26/2024   9:33 AM EDT To: Theressa Flatness  ----- Message from Thena Fireman, CMA sent at 04/26/2024  9:33 AM EDT ----- Patient asking for a letter sent to her house of the name of testing Dr.Dohmeier  has ordered . MRI ,sleep study,Neuro-psychological testing

## 2024-04-26 NOTE — Telephone Encounter (Signed)
 Spoke to patient and husband (checked DPR) on speaker phone gave lab results and Dr. Albertina Hugger recommendations. Pt and husband wanted to have a letter sent to their home stating the testing Dr Albertina Hugger has ordered  Verified patient address . Patient and husband was very appreciative Pt thanked me for calling. Forward request to medical records

## 2024-04-26 NOTE — Telephone Encounter (Signed)
-----   Message from Darby Dohmeier sent at 04/21/2024  6:00 PM EDT ----- Alzheimer disease bio markers were positive.  MRI ordered and referral for Neuro-psychological testing initiated.

## 2024-04-26 NOTE — Telephone Encounter (Signed)
 Noted

## 2024-05-09 ENCOUNTER — Ambulatory Visit (INDEPENDENT_AMBULATORY_CARE_PROVIDER_SITE_OTHER): Admitting: Neurology

## 2024-05-09 DIAGNOSIS — I480 Paroxysmal atrial fibrillation: Secondary | ICD-10-CM

## 2024-05-09 DIAGNOSIS — G4733 Obstructive sleep apnea (adult) (pediatric): Secondary | ICD-10-CM | POA: Diagnosis not present

## 2024-05-09 DIAGNOSIS — R419 Unspecified symptoms and signs involving cognitive functions and awareness: Secondary | ICD-10-CM

## 2024-05-10 NOTE — Progress Notes (Signed)
 [facility] [name] 83 year old female [birth year]   HOME SLEEP TEST REPORT ( by Watch PAT)   STUDY DATE:  05-09-2024   ORDERING CLINICIAN:  REFERRING CLINICIAN:  Dr Teresa, MD    CLINICAL INFORMATION/HISTORY:  Elizabeth Russell is a 83 y.o. female patient who is seen upon PCP ( Dr Teresa)  referral on 04/13/2024  for a memory evaluation, not further classified- this patient reported that a memory concern was known for 2-3 years and she had some test in the PCP's office.   She is here today with Lynwood, who is her husband. She endorsed independence in all aspects of daily living, ( ADL questionnaire )   and her husband agreed : She cooks, she drives, she plans their days and handles the finances.  HX of atrial fibrillation,  DM 2, GERD,  CKD, but  no previous sleep testing ( 4 attempts at PSG and HST were made by [facility] and cardiology-  and was now referred for neurocognitive disorder per PCP.  MOCA 13/ 30 points.      Epworth sleepiness score: 3 /24.   BMI:  22 kg/m   Neck Circumference: 13   FINDINGS:   Sleep Summary:   Total Recording Time (hours, min): 8 hours 39 minutes       Total Sleep Time (hours, min): 7 hours 35 minutes                 Percent REM (%): 10%                                       Respiratory Indices:   Calculated pAHI (per  CMS guideline): 6.4/h                         REM pAHI:   9.6/h                                              NREM pAHI:    6/h                          Positional AHI: The supine AHI was the highest at 7.3/h followed by right lateral sleep with an AHI of 4.7/h basically avoiding supine sleep would allow this patient's AHI to be under 5/h.  Snoring level reached a mean volume of 40 dB, which is a threshold of detection for this device.  There were only 3% of the total night with any audible breathing recorded.                                                 Oxygen Saturation Statistics:   Oxygen Saturation (%)  Mean: The mean oxygen saturation was 94%                O2 Saturation Range (%):     The nadir of oxygen saturation was 89% and a maximum of 98%.  O2 Saturation (minutes) <89%: 0 minutes         Pulse Rate Statistics:   Pulse Mean (bpm):   50 bpm              Pulse Range: Between 43 and 66 bpm.  Given the patient's history of atrial fibrillation it is entirely possible that this bradycardia is related to rate controlling medication.                IMPRESSION:  This HST confirms the presence of only mild apnea  without snoring or hypoxemia and would not require CPAP therapy. Weight loss is not needed for this slender individual.    RECOMMENDATION: My only recommendation is to avoid supine sleep.  Any question regarding parasomnia activity cannot be answered based on a HST.     INTERPRETING PHYSICIAN:  Dedra Gores, MD  [facility] Sleep Board certified by The [employer] of Sleep Medicine and Diplomate of the [employer] of Sleep Medicine. Board certified In Neurology through the ABPN, Fellow of the [employer] of Neurology.

## 2024-05-22 ENCOUNTER — Telehealth: Payer: Self-pay | Admitting: Neurology

## 2024-05-22 NOTE — Telephone Encounter (Signed)
 At this time, Dr Chalice has not yet reviewed. She just returned from vacation today. I will forward to her to inform her that she is reaching out asking but we will contact once results have been reviewed by MD

## 2024-05-22 NOTE — Telephone Encounter (Signed)
Pt is asking for a call with results to her sleep study

## 2024-05-26 NOTE — Procedures (Signed)
 [facility] [name] 83 year old female [birth year]   HOME SLEEP TEST REPORT ( by Watch PAT)   STUDY DATE:  05-09-2024   ORDERING CLINICIAN:  REFERRING CLINICIAN:  Dr Teresa, MD    CLINICAL INFORMATION/HISTORY:  Elizabeth Russell is a 83 y.o. female patient who is seen upon PCP ( Dr Teresa)  referral on 04/13/2024  for a memory evaluation, not further classified- this patient reported that a memory concern was known for 2-3 years and she had some test in the PCP's office.   She is here today with Lynwood, who is her husband. She endorsed independence in all aspects of daily living, ( ADL questionnaire )   and her husband agreed : She cooks, she drives, she plans their days and handles the finances.  HX of atrial fibrillation,  DM 2, GERD,  CKD, but  no previous sleep testing ( 4 attempts at PSG and HST were made by [facility] and cardiology-  and was now referred for neurocognitive disorder per PCP.  MOCA 13/ 30 points.      Epworth sleepiness score: 3 /24.   BMI:  22 kg/m   Neck Circumference: 13   FINDINGS:   Sleep Summary:   Total Recording Time (hours, min): 8 hours 39 minutes       Total Sleep Time (hours, min): 7 hours 35 minutes                 Percent REM (%): 10%                                       Respiratory Indices:   Calculated pAHI (per  CMS guideline): 6.4/h                         REM pAHI:   9.6/h                                              NREM pAHI:    6/h                          Positional AHI: The supine AHI was the highest at 7.3/h followed by right lateral sleep with an AHI of 4.7/h basically avoiding supine sleep would allow this patient's AHI to be under 5/h.  Snoring level reached a mean volume of 40 dB, which is a threshold of detection for this device.  There were only 3% of the total night with any audible breathing recorded.                                                 Oxygen Saturation Statistics:   Oxygen Saturation (%)  Mean: The mean oxygen saturation was 94%                O2 Saturation Range (%):     The nadir of oxygen saturation was 89% and a maximum of 98%.  O2 Saturation (minutes) <89%: 0 minutes         Pulse Rate Statistics:   Pulse Mean (bpm):   50 bpm              Pulse Range: Between 43 and 66 bpm.  Given the patient's history of atrial fibrillation it is entirely possible that this bradycardia is related to rate controlling medication.                IMPRESSION:  This HST confirms the presence of only mild apnea  without snoring or hypoxemia and would not require CPAP therapy. Weight loss is not needed for this slender individual.    RECOMMENDATION: My only recommendation is to avoid supine sleep.  Any question regarding parasomnia activity cannot be answered based on a HST.     INTERPRETING PHYSICIAN:  Dedra Gores, MD  [facility] Sleep Board certified by The [employer] of Sleep Medicine and Diplomate of the [employer] of Sleep Medicine. Board certified In Neurology through the ABPN, Fellow of the [employer] of Neurology.

## 2024-06-08 NOTE — Telephone Encounter (Signed)
 Call to Patient to advise she had minimal OSA mainly during supine sleep so recommendation is to avoid sleeping supine.  No further followup for sleep needed - she has followup with Dr. Barbette, advised to reach out to us  if she is struggling to adjust to sleeping on her back. Patient verbalizes understanding.

## 2024-06-08 NOTE — Telephone Encounter (Signed)
-----   Message from Wilbert Bihari sent at 05/28/2024  8:03 PM EDT ----- Patient had minimal OSA mainly during supine sleep so recommendation is to avoid sleeping supine.  No further followup for sleep needed - she has followup with Dr. Barbette ----- Message ----- From: Chalice Saunas, MD Sent: 05/26/2024   9:25 AM EDT To: Wilbert JONELLE Bihari, MD

## 2024-06-22 DIAGNOSIS — I129 Hypertensive chronic kidney disease with stage 1 through stage 4 chronic kidney disease, or unspecified chronic kidney disease: Secondary | ICD-10-CM | POA: Diagnosis not present

## 2024-06-22 DIAGNOSIS — I480 Paroxysmal atrial fibrillation: Secondary | ICD-10-CM | POA: Diagnosis not present

## 2024-06-22 DIAGNOSIS — R634 Abnormal weight loss: Secondary | ICD-10-CM | POA: Diagnosis not present

## 2024-06-22 DIAGNOSIS — E538 Deficiency of other specified B group vitamins: Secondary | ICD-10-CM | POA: Diagnosis not present

## 2024-06-22 DIAGNOSIS — E785 Hyperlipidemia, unspecified: Secondary | ICD-10-CM | POA: Diagnosis not present

## 2024-06-22 DIAGNOSIS — N183 Chronic kidney disease, stage 3 unspecified: Secondary | ICD-10-CM | POA: Diagnosis not present

## 2024-06-22 DIAGNOSIS — E1121 Type 2 diabetes mellitus with diabetic nephropathy: Secondary | ICD-10-CM | POA: Diagnosis not present

## 2024-06-27 ENCOUNTER — Encounter: Payer: Self-pay | Admitting: Physician Assistant

## 2024-09-06 ENCOUNTER — Other Ambulatory Visit: Payer: Self-pay | Admitting: Cardiology

## 2024-09-06 NOTE — Telephone Encounter (Signed)
 Prescription refill request for Eliquis  received. Indication:afib Last office visit:11/24 Scr:1.13  2025 Age: 83 Weight:58.5  kg  Under review

## 2024-09-20 NOTE — Progress Notes (Addendum)
 Assessment/Plan:     Elizabeth Russell is a very pleasant 83 y.o. year old RH female with a history of hypertension, hyperlipidemia, PAF, DM2, CKD, mild OSA (not warranting CPAP per latest sleep study) seen today for evaluation of memory loss. MoCA today is 14/30 .  Etiology is unclear, workup is in progress, however, there is high suspicion for Alzheimer's disease.  Patient is able to participate on ADLs, she continues to drive.  Discussed starting memantine after neuropsych (avoiding AChi due to bradycardia)   Memory Impairment of unclear etiology, concern for Alzheimer's disease   MRI brain without contrast to assess for underlying structural abnormality and assess vascular load  Neurocognitive testing to further evaluate cognitive concerns and determine other underlying cause of memory changes, including potential contribution from sleep, anxiety, attention, or depression among others  Check B12, TSH Recommend good control of cardiovascular risk factors.   Continue to control mood as per PCP Folllow up in 3 months   Subjective:    The patient is accompanied by her husband who supplements  the history.    How long did patient have memory difficulties?  For about  2 years.  Patient reports difficulty remembering new information, recent conversations, names. She likes to do crossword puzzles and  word finding, reading newspaper, watching [employer] of [employer]. Does yard work.  repeats oneself?  Endorsed Disoriented when walking into a room? Denies    Leaving objects in unusual places?  Denies.   Wandering behavior? Denies.   Any personality changes, or depression, anxiety? Denies but husband reports she may be more irritable than before.    Hallucinations or paranoia? Denies.   Seizures? Denies.    Any sleep changes?  Does not sleep well, wakes up in the middle of the night. Reports having had been yelling in her sleep without arm or leg movements, she is not dure if they are nightmares.   Denies sleepwalking   Sleep apnea? Endorsed, not need for CPAP   Any hygiene concerns?  Denies.   Independent of bathing and dressing? Endorsed  Does the patient need help with medications?  Patient is in charge   Who is in charge of the finances?  Patient is in charge, husband monitors     Any changes in appetite?  I have cut back on my food. Drinks plenty water     Patient have trouble swallowing?  Denies.   Does the patient cook? Yes, Has been forgetting common recipes for the last 3-4 months. Denies kitchen accidents   Any headaches?  Denies.   Chronic pain? Denies.   Ambulates with difficulty? Denies. Walks frequently  Recent falls or head injuries? 2 months ago fell doing the leaves hitting the head, no LOC, did not seek medical attention Vision changes?  Denies any new issues.  Has a history of cataracts Any strokelike symptoms? Denies.   Any tremors? Denies.   Any anosmia? Denies.   Any incontinence of urine? Urge incontinence.  Any bowel dysfunction? Denies.      Patient lives with husband    History of heavy alcohol intake? Denies.   History of heavy tobacco use? Denies.   Family history of dementia?   Denies  Does patient drive? Yes, denies getting lost. One episode 5 years ago she did not remember how to get home.   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     No Known Allergies  Current Outpatient Medications  Medication Instructions   apixaban  (ELIQUIS ) 2.5  mg, Oral, 2 times daily   ezetimibe  (ZETIA ) 10 mg, Oral, Daily   losartan  (COZAAR ) 25 MG tablet Take 1 tablet (25 mg total) by mouth in the morning, then take 1/2 tablet (12.5 mg total) by mouth in the evening, everyday.   metoprolol  tartrate (LOPRESSOR ) 12.5 mg, Oral, 2 times daily, May take an extra 1/2 dose as needed if HR>100 bpm.     VITALS:   Vitals:   09/27/24 0940  BP: 115/67  Pulse: (!) 51  Resp: 20  SpO2: 98%  Weight: 122 lb (55.3 kg)  Height: 5' 4 (1.626 m)     Neuro Exam   :    09/27/2024   10:00 AM 04/13/2024    2:05 PM  Montreal Cognitive Assessment   Visuospatial/ Executive (0/5) 0 2  Naming (0/3) 0 0  Attention: Read list of digits (0/2) 2 1  Attention: Read list of letters (0/1) 1 1  Attention: Serial 7 subtraction starting at 100 (0/3) 0 1  Language: Repeat phrase (0/2) 1 2  Language : Fluency (0/1) 1 1  Abstraction (0/2) 2 0  Delayed Recall (0/5) 0 0  Orientation (0/6) 6 5  Total 13 13  Adjusted Score (based on education) 14         No data to display              Orientation:  Alert and oriented to person, place and to time . No aphasia or dysarthria. Fund of knowledge is appropriate. Recent and remote memory impaired.  Attention and concentration are reduced . Unable to name objects and repeat phrases 1/2.  Delayed recall  0/5 .  Cranial nerves: There is good facial symmetry. Extraocular muscles are intact and visual fields are full to confrontational testing. Speech is fluent and clear. No tongue deviation. Hearing is intact to conversational tone.  Tone: Tone is good throughout. Sensation: Sensation is intact to light touch.  Vibration is intact at the bilateral big toe.  Coordination: The patient has no difficulty with RAM's or FNF bilaterally. Normal finger to nose  Motor: Strength is 5/5 in the bilateral upper and lower extremities. There is no pronator drift. There are no fasciculations noted. DTR's: Deep tendon reflexes are 2/4 bilaterally. Gait and Station: The patient is able to ambulate without difficulty. Gait is cautious and narrow. Stride length is normal.         Thank you for allowing us  the opportunity to participate in the care of this nice patient. Please do not hesitate to contact us  for any questions or concerns.   Total time spent on today's visit was 61 minutes dedicated to this patient today, preparing to see patient, examining the patient, ordering tests and/or medications and counseling the patient,  documenting clinical information in the EHR or other health record, independently interpreting results and communicating results to the patient/family, discussing treatment and goals, answering patient's questions and coordinating care.  Cc:  Teresa Channel, MD  Camie Sevin 09/27/2024 10:44 AM

## 2024-09-27 ENCOUNTER — Encounter: Payer: Self-pay | Admitting: Physician Assistant

## 2024-09-27 ENCOUNTER — Ambulatory Visit: Admitting: Physician Assistant

## 2024-09-27 ENCOUNTER — Ambulatory Visit

## 2024-09-27 ENCOUNTER — Other Ambulatory Visit

## 2024-09-27 VITALS — BP 115/67 | HR 51 | Resp 20 | Ht 64.0 in | Wt 122.0 lb

## 2024-09-27 DIAGNOSIS — R413 Other amnesia: Secondary | ICD-10-CM | POA: Insufficient documentation

## 2024-09-27 NOTE — Patient Instructions (Signed)
 It was a pleasure to see you today at our office.   Recommendations:  Neurocognitive evaluation at our office   MRI of the brain, the radiology office will call you to arrange you appointment    Check labs today  Follow up in  3 months     [email or web address]   RECOMMENDATIONS FOR ALL PATIENTS WITH MEMORY PROBLEMS: 1. Continue to exercise (Recommend 30 minutes of walking everyday, or 3 hours every week) 2. Increase social interactions - continue going to [place] and enjoy social gatherings with friends and family 3. Eat healthy, avoid fried foods and eat more fruits and vegetables 4. Maintain adequate blood pressure, blood sugar, and blood cholesterol level. Reducing the risk of stroke and cardiovascular disease also helps promoting better memory. 5. Avoid stressful situations. Live a simple life and avoid aggravations. Organize your time and prepare for the next day in anticipation. 6. Sleep well, avoid any interruptions of sleep and avoid any distractions in the bedroom that may interfere with adequate sleep quality 7. Avoid sugar, avoid sweets as there is a strong link between excessive sugar intake, diabetes, and cognitive impairment We discussed the Mediterranean diet, which has been shown to help patients reduce the risk of progressive memory disorders and reduces cardiovascular risk. This includes eating fish, eat fruits and green leafy vegetables, nuts like almonds and hazelnuts, walnuts, and also use olive oil. Avoid fast foods and fried foods as much as possible. Avoid sweets and sugar as sugar use has been linked to worsening of memory function.  There is always a concern of gradual progression of memory problems. If this is the case, then we may need to adjust level of care according to patient needs. Support, both to the patient and caregiver, should then be put into place.      You have been referred for a  neuropsychological evaluation (i.e., evaluation of memory and thinking abilities). Please bring someone with you to this appointment if possible, as it is helpful for the doctor to hear from both you and another adult who knows you well. Please bring eyeglasses and hearing aids if you wear them.    The evaluation will take approximately 3 hours and has two parts:   The first part is a clinical interview with the neuropsychologist (Dr. Richie or Dr. Gayland). During the interview, the neuropsychologist will speak with you and the individual you brought to the appointment.    The second part of the evaluation is testing with the doctor's technician Neal or Luke). During the testing, the technician will ask you to remember different types of material, solve problems, and answer some questionnaires. Your family member will not be present for this portion of the evaluation.   Please note: We must reserve several hours of the neuropsychologist's time and the psychometrician's time for your evaluation appointment. As such, there is a No-Show fee of $100. If you are unable to attend any of your appointments, please contact our office as soon as possible to reschedule.      DRIVING: Regarding driving, in patients with progressive memory problems, driving will be impaired. We advise to have someone else do the driving if trouble finding directions or if minor accidents are reported. Independent driving assessment is available to determine safety of driving.   If you are interested in the driving assessment, you can contact the following:  [facility] [employer] [facility] in [place] [phone]  [facility] [phone]  [facility] [phone]  [facility] [phone] or  [phone]   FALL PRECAUTIONS: Be cautious when walking. Scan the area for obstacles that may increase the risk of trips and falls. When getting up in the mornings, sit up at the edge of the bed for a  few minutes before getting out of bed. Consider elevating the bed at the head end to avoid drop of blood pressure when getting up. Walk always in a well-lit room (use night lights in the walls). Avoid area rugs or power cords from appliances in the middle of the walkways. Use a walker or a cane if necessary and consider physical therapy for balance exercise. Get your eyesight checked regularly.  FINANCIAL OVERSIGHT: Supervision, especially oversight when making financial decisions or transactions is also recommended.  HOME SAFETY: Consider the safety of the kitchen when operating appliances like stoves, microwave oven, and blender. Consider having supervision and share cooking responsibilities until no longer able to participate in those. Accidents with firearms and other hazards in the house should be identified and addressed as well.   ABILITY TO BE LEFT ALONE: If patient is unable to contact 911 operator, consider using LifeLine, or when the need is there, arrange for someone to stay with patients. Smoking is a fire hazard, consider supervision or cessation. Risk of wandering should be assessed by caregiver and if detected at any point, supervision and safe proof recommendations should be instituted.  MEDICATION SUPERVISION: Inability to self-administer medication needs to be constantly addressed. Implement a mechanism to ensure safe administration of the medications.      Mediterranean Diet A Mediterranean diet refers to food and lifestyle choices that are based on the traditions of countries located on the [employer]. This way of eating has been shown to help prevent certain conditions and improve outcomes for people who have chronic diseases, like kidney disease and heart disease. What are tips for following this plan? Lifestyle  Cook and eat meals together with your family, when possible. Drink enough fluid to keep your urine clear or pale yellow. Be physically active every day.  This includes: Aerobic exercise like running or swimming. Leisure activities like gardening, walking, or housework. Get 7-8 hours of sleep each night. If recommended by your health care provider, drink red wine in moderation. This means 1 glass a day for nonpregnant women and 2 glasses a day for men. A glass of wine equals 5 oz (150 mL). Reading food labels  Check the serving size of packaged foods. For foods such as rice and pasta, the serving size refers to the amount of cooked product, not dry. Check the total fat in packaged foods. Avoid foods that have saturated fat or trans fats. Check the ingredients list for added sugars, such as corn syrup. Shopping  At the grocery store, buy most of your food from the areas near the walls of the store. This includes: Fresh fruits and vegetables (produce). Grains, beans, nuts, and seeds. Some of these may be available in unpackaged forms or large amounts (in bulk). Fresh seafood. Poultry and eggs. Low-fat dairy products. Buy whole ingredients instead of prepackaged foods. Buy fresh fruits and vegetables in-season from local farmers markets. Buy frozen fruits and vegetables in resealable bags. If you do not have access to quality fresh seafood, buy precooked frozen shrimp or canned fish, such as tuna, salmon, or sardines. Buy small amounts of raw or cooked vegetables, salads, or olives from the deli or salad bar at your store. Stock your pantry so you always have certain foods on hand, such  as olive oil, canned tuna, canned tomatoes, rice, pasta, and beans. Cooking  Cook foods with extra-virgin olive oil instead of using butter or other vegetable oils. Have meat as a side dish, and have vegetables or grains as your main dish. This means having meat in small portions or adding small amounts of meat to foods like pasta or stew. Use beans or vegetables instead of meat in common dishes like chili or lasagna. Experiment with different cooking methods.  Try roasting or broiling vegetables instead of steaming or sauteing them. Add frozen vegetables to soups, stews, pasta, or rice. Add nuts or seeds for added healthy fat at each meal. You can add these to yogurt, salads, or vegetable dishes. Marinate fish or vegetables using olive oil, lemon juice, garlic, and fresh herbs. Meal planning  Plan to eat 1 vegetarian meal one day each week. Try to work up to 2 vegetarian meals, if possible. Eat seafood 2 or more times a week. Have healthy snacks readily available, such as: Vegetable sticks with hummus. Greek yogurt. Fruit and nut trail mix. Eat balanced meals throughout the week. This includes: Fruit: 2-3 servings a day Vegetables: 4-5 servings a day Low-fat dairy: 2 servings a day Fish, poultry, or lean meat: 1 serving a day Beans and legumes: 2 or more servings a week Nuts and seeds: 1-2 servings a day Whole grains: 6-8 servings a day Extra-virgin olive oil: 3-4 servings a day Limit red meat and sweets to only a few servings a month What are my food choices? Mediterranean diet Recommended Grains: Whole-grain pasta. Brown rice. Bulgar wheat. Polenta. Couscous. Whole-wheat bread. [place] Made[place]. Vegetables: Artichokes. Beets. Broccoli. Cabbage. Carrots. Eggplant. Green beans. Chard. Kale. Spinach. Onions. Leeks. Peas. Squash. Tomatoes. Peppers. Radishes. Fruits: Apples. Apricots. Avocado. Berries. Bananas. Cherries. Dates. Figs. Grapes. Lemons. Melon. Oranges. Peaches. Plums. Pomegranate. Meats and other protein foods: Beans. Almonds. Sunflower seeds. Pine nuts. Peanuts. Cod. Salmon. Scallops. Shrimp. Tuna. Tilapia. Clams. Oysters. Eggs. Dairy: Low-fat milk. Cheese. Greek yogurt. Beverages: Water. Red wine. Herbal tea. Fats and oils: Extra virgin olive oil. Avocado oil. Grape seed oil. Sweets and desserts: Greek yogurt with honey. Baked apples. Poached pears. Trail mix. Seasoning and other foods: Basil. Cilantro. Coriander. Cumin. Mint.  Parsley. Sage. Rosemary. Tarragon. Garlic. Oregano. Thyme. Pepper. Balsalmic vinegar. Tahini. Hummus. Tomato sauce. Olives. Mushrooms. Limit these Grains: Prepackaged pasta or rice dishes. Prepackaged cereal with added sugar. Vegetables: Deep fried potatoes (french fries). Fruits: Fruit canned in syrup. Meats and other protein foods: Beef. Pork. Lamb. Poultry with skin. Hot dogs. [place]. Dairy: Ice cream. Sour cream. Whole milk. Beverages: Juice. Sugar-sweetened soft drinks. Beer. Liquor and spirits. Fats and oils: Butter. Canola oil. Vegetable oil. Beef fat (tallow). Lard. Sweets and desserts: Cookies. Cakes. Pies. Candy. Seasoning and other foods: Mayonnaise. Premade sauces and marinades. The items listed may not be a complete list. Talk with your dietitian about what dietary choices are right for you. Summary The Mediterranean diet includes both food and lifestyle choices. Eat a variety of fresh fruits and vegetables, beans, nuts, seeds, and whole grains. Limit the amount of red meat and sweets that you eat. Talk with your health care provider about whether it is safe for you to drink red wine in moderation. This means 1 glass a day for nonpregnant women and 2 glasses a day for men. A glass of wine equals 5 oz (150 mL). This information is not intended to replace advice given to you by your health care provider. Make sure you discuss  any questions you have with your health care provider. Document Released: 06/25/2016 Document Revised: 07/28/2016 Document Reviewed: 06/25/2016 Elsevier Interactive Patient Education  2017 [employer].

## 2024-09-28 ENCOUNTER — Ambulatory Visit: Payer: Self-pay | Admitting: Physician Assistant

## 2024-09-28 LAB — VITAMIN B12: Vitamin B-12: 715 pg/mL (ref 200–1100)

## 2024-09-28 LAB — TSH: TSH: 1.54 m[IU]/L (ref 0.40–4.50)

## 2024-09-29 ENCOUNTER — Other Ambulatory Visit: Payer: Self-pay | Admitting: Cardiology

## 2024-10-16 ENCOUNTER — Ambulatory Visit: Admitting: Cardiovascular Disease

## 2024-10-16 ENCOUNTER — Encounter: Payer: Self-pay | Admitting: Cardiovascular Disease

## 2024-10-16 ENCOUNTER — Ambulatory Visit: Attending: Cardiovascular Disease | Admitting: Cardiovascular Disease

## 2024-10-16 VITALS — BP 158/100 | HR 71 | Ht 64.0 in | Wt 124.4 lb

## 2024-10-16 DIAGNOSIS — I340 Nonrheumatic mitral (valve) insufficiency: Secondary | ICD-10-CM

## 2024-10-16 DIAGNOSIS — I480 Paroxysmal atrial fibrillation: Secondary | ICD-10-CM

## 2024-10-16 DIAGNOSIS — I10 Essential (primary) hypertension: Secondary | ICD-10-CM | POA: Diagnosis not present

## 2024-10-16 NOTE — Progress Notes (Signed)
 Chief Complaint  Patient presents with   Follow-up    Atrial fibrillation   History of Present Illness: 83 yo female with history of atrial fibrillation, HTN, DM, memory issues and CKD who is here today for follow up. She has been followed in our office by Dr. Hobart. She was evaluated in 2021 for chest pain that was felt to be GI related. She had dizziness with standing in 2022 and her Losartan  was decreased. She was found to be in atrial fib in December 2022. She was started on Eliquis  and metoprolol . Echo in December 2022 with LVEF=65-70%, mild mitral regurgitation. Statin stopped in October 2023 due to memory issues. She was changed to Zetia . She was seen by Dr. Shlomo in September 2024 and described chest pain at rest. Coronary CTA was ordered but not completed by the patient as she did not feel that her pain was severe. She was seen in our office in November 2024 and noted daytime somnolence. Sleep study June 2025 with only mild apnea and CPAP was not recommended.   She is here today for follow up. The patient denies any chest pain, dyspnea, palpitations, lower extremity edema, orthopnea, PND, dizziness, near syncope or syncope.   Primary Care Physician: Teresa Channel, MD   Past Medical History:  Diagnosis Date   CKD (chronic kidney disease) stage 3, GFR 30-59 ml/min (HCC)    GERD (gastroesophageal reflux disease)    Hypertension    PAF (paroxysmal atrial fibrillation) (HCC)    Pre-diabetes     History reviewed. No pertinent surgical history.  Current Outpatient Medications  Medication Sig Dispense Refill   apixaban  (ELIQUIS ) 2.5 MG TABS tablet Take 1 tablet (2.5 mg total) by mouth 2 (two) times daily. 60 tablet 5   Cyanocobalamin  (VITAMIN B 12 PO) Take 1,000 Units by mouth as needed.     ezetimibe  (ZETIA ) 10 MG tablet Take 1 tablet (10 mg total) by mouth daily. 30 tablet 11   losartan  (COZAAR ) 25 MG tablet Take 1 tablet (25 mg total) by mouth in the morning, then take 1/2  tablet (12.5 mg total) by mouth in the evening, everyday. (Patient taking differently: 1/2 tab po qd) 135 tablet 2   metoprolol  tartrate (LOPRESSOR ) 25 MG tablet Take 0.5 tablets (12.5 mg total) by mouth 2 (two) times daily. May take an extra 1/2 dose as needed if HR>100 bpm. 68 tablet 0   No current facility-administered medications for this visit.    No Known Allergies  Social History   Socioeconomic History   Marital status: Married    Spouse name: Lynwood   Number of children: 3   Years of education: 12   Highest education level: High school graduate  Occupational History   Not on file  Tobacco Use   Smoking status: Never   Smokeless tobacco: Never  Vaping Use   Vaping status: Never Used  Substance and Sexual Activity   Alcohol use: No   Drug use: No   Sexual activity: Not on file  Other Topics Concern   Not on file  Social History Narrative   Lives at home with her husband   Right handed   Caffeine: 1 c coffee a day    Social Drivers of Corporate Investment Banker Strain: Not on file  Food Insecurity: Not on file  Transportation Needs: Not on file  Physical Activity: Not on file  Stress: Not on file  Social Connections: Not on file  Intimate Partner Violence: Not  on file    Family History  Problem Relation Age of Onset   High blood pressure Mother    Heart Problems Father     Review of Systems:  As stated in the HPI and otherwise negative.   BP (!) 158/100 (BP Location: Left Arm, Patient Position: Sitting, Cuff Size: Normal)   Pulse 71   Ht 5' 4 (1.626 m)   Wt 124 lb 6.4 oz (56.4 kg)   SpO2 97%   BMI 21.35 kg/m   Physical Examination: General: Well developed, well nourished, NAD  HEENT: OP clear, mucus membranes moist  SKIN: warm, dry. No rashes. Neuro: No focal deficits  Musculoskeletal: Muscle strength 5/5 all ext  Psychiatric: Mood and affect normal  Neck: No JVD, no carotid bruits, no thyromegaly, no lymphadenopathy.  Lungs:Clear  bilaterally, no wheezes, rhonci, crackles Cardiovascular: Regular rate and rhythm. No murmurs, gallops or rubs. Abdomen:Soft. Bowel sounds present. Non-tender.  Extremities: No lower extremity edema. Pulses are 2 + in the bilateral DP/PT.  EKG:  EKG is ordered today. The ekg ordered today demonstrates  EKG Interpretation Date/Time:  Monday October 16 2024 11:11:21 EST Ventricular Rate:  49 PR Interval:  166 QRS Duration:  80 QT Interval:  402 QTC Calculation: 363 R Axis:   28  Text Interpretation: Sinus bradycardia Poor R wave progression Confirmed by Verlin Bruckner [phone]) on 10/16/2024 11:13:19 AM   Recent Labs: 09/27/2024: TSH 1.54   Lipid Panel    Component Value Date/Time   CHOL 196 08/17/2023 1126   TRIG 75 08/17/2023 1126   HDL 71 08/17/2023 1126   CHOLHDL 2.8 08/17/2023 1126   LDLCALC 111 (H) 08/17/2023 1126     Wt Readings from Last 3 Encounters:  10/16/24 124 lb 6.4 oz (56.4 kg)  09/27/24 122 lb (55.3 kg)  04/13/24 129 lb (58.5 kg)    Assessment and Plan:   1. Paroxysmal atrial fibrillation: Sinus today. No palpitations.  -Continue metoprolol  and Eliquis .   2. HTN: BP is well controlled at home. Continue Losartan  and Lopressor   3. Mitral regurgitation: Mild by echo in 2022. No loud murmur on exam. Will repeat echo in one year.   Labs/ tests ordered today include:   Orders Placed This Encounter  Procedures   EKG 12-Lead   Disposition:   F/U with me in one year  Signed, Bruckner Verlin, MD, [facility] 10/16/2024 12:12 PM    [facility] [address] [place], [place], [place]  [address] Phone: [phone]; Fax: [phone]

## 2024-10-16 NOTE — Patient Instructions (Signed)

## 2024-10-25 ENCOUNTER — Ambulatory Visit
Admission: RE | Admit: 2024-10-25 | Discharge: 2024-10-25 | Disposition: A | Discharge status: Home / Self Care | Source: Ambulatory Visit | Attending: Physician Assistant | Admitting: Physician Assistant

## 2024-10-31 ENCOUNTER — Ambulatory Visit: Admitting: Adult Health

## 2024-11-01 ENCOUNTER — Ambulatory Visit: Admitting: Adult Health

## 2024-11-01 NOTE — Progress Notes (Signed)
 No answer at 11/01/2024 at 10:18am

## 2024-11-02 NOTE — Progress Notes (Signed)
 I left message to call office at 4:11pm

## 2024-11-03 ENCOUNTER — Ambulatory Visit: Payer: Self-pay | Admitting: Psychology

## 2024-11-03 ENCOUNTER — Ambulatory Visit: Payer: Self-pay

## 2024-11-03 DIAGNOSIS — F04 Amnestic disorder due to known physiological condition: Secondary | ICD-10-CM

## 2024-11-03 DIAGNOSIS — F03A0 Unspecified dementia, mild, without behavioral disturbance, psychotic disturbance, mood disturbance, and anxiety: Secondary | ICD-10-CM | POA: Diagnosis not present

## 2024-11-03 DIAGNOSIS — R4189 Other symptoms and signs involving cognitive functions and awareness: Secondary | ICD-10-CM

## 2024-11-03 NOTE — Progress Notes (Signed)
 "  NEUROPSYCHOLOGICAL EVALUATION [place]  [place] Department of Neurology  Date of Evaluation: 11/03/2024  REASON FOR REFERRAL   Elizabeth Russell is an 83 year old, right-handed, Black female with 12 years of formal education. She was referred for neuropsychological evaluation by Camie Sevin, PA-C, to assess current neurocognitive functioning, document potential cognitive deficits, and assist with treatment planning. This is her first neuropsychological evaluation.  SUMMARY OF RESULTS   Premorbid cognitive abilities are estimated to be in the average range based on word reading and sociodemographic factors. Relative to this baseline estimate, current performance was consistently below expectations on tasks of learning/memory and variable across other cognitive domains.  Specifically, she demonstrated poor encoding, recall, and recognition of a word list, short stories, and shapes.   Working memory was largely preserved, with the exception of a single low score on a sequencing task; however, this appeared due to perseveration on the instructions from the previous trial.   Processing speed was generally adequate, although she was slow on a rapid decoding task. Even more notably, she drew the symbols correctly but over half were in the wrong orientation.  On tasks of executive functioning, she demonstrated poor abstract reasoning and discontinued tasks of response inhibition. She performed relatively better on tasks of alternating attention and verbal fluency. However, although her verbal fluency was adequate, the norms for semantic fluency appeared somewhat generous.  Confrontation naming was markedly impaired. She correctly identified only 10 objects and produced only two additional words with phonemic cues, despite both semantic and phonemic prompting.   She had significant difficulty judging line orientations and, on a simple figure copying task, produced shapes that bore  little resemblance to the target stimuli. Performance improved slightly when copying a single figure with multiple details, though she still missed at least one main feature. She performed adequately relative to peers on a block design task, although these norms also appeared somewhat generous.  On self-report questionnaires, she did not report significant symptoms of depression or anxiety.  DIAGNOSTIC IMPRESSION   On assessment, the patient demonstrated cognitive deficits across multiple domains, with the most pronounced impairments observed in memory and confrontation naming. When both cognitive performance and functional abilities are considered, her presentation lies at the boundary between advanced mild cognitive impairment and mild dementia. Although day-to-day functioning appears largely preserved, the severity of her cognitive deficits raises significant concern regarding the accuracy of this impression and the likelihood of impending functional decline. Accordingly, a diagnosis of mild dementia is most appropriate at this time. Although prior neuroimaging has been largely unremarkable, the overall clinical and cognitive profile remains concerning for an underlying Alzheimers disease process. Additional neurological workup could be considered for diagnostic clarification but is unlikely to substantially alter management at this time.   Importantly, no significant psychological/emotional, sleep-related, physical, or lifestyle factors were identified that would suggest alternative explanations for the observed cognitive decline.  Of note, she demonstrated some atypical visual findings during assessment; while these may reflect brain-based changes, a routine vision evaluation may be helpful to rule out contributory visual factors, particularly given that she continues to drive.  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  Discuss with your neurologist the risks and benefits of starting a memory medication. While this medication may slow functional decline in some  individuals, benefits are variable and often modest.  Findings from this evaluation raise concern about the patient's ability to safely drive. If she insists on continuing to drive, it is strongly recommended that she undergo a driving evaluation. The following agencies may offer such an evaluation:  [facility] [employer] [facility] in [place]: [phone] [facility] in [place]: [phone] [facility] in [place]: [phone] [facility] in [place]: [phone] or [phone]  If not done recently, it is recommended that she undergo a comprehensive vision assessment, as she was observed copying objects in incorrect orientations, producing drawings that do not resemble the originals, and having difficulty perceiving line orientations.  Given her current sleep patterns and recent difficulties, she may benefit from implementing sleep hygiene techniques, including:  Go to bed and get up at the same time each day to help your body establish a regular rhythm. Establish and maintain a bedtime routine. Certain activities such as stretching, meditating, listening to soft music, or reading ~15 minutes before bedtime can be a great way to regularly get your brain and body ready for sleep. Avoid taking naps during the day. Avoid alcohol and caffeine for 5 or 6 hours before going to bed. Get regular exercise, but not in the hours before bedtime. Use comfortable bedding and maintain a cool temperature in your bedroom. Block out light and distracting noise. Avoid watching television or using your phone/computer in bed. Avoid  staying in bed if you have difficulty falling asleep. If you have not been able to get to sleep after about 20 minutes or more, get up and do something calming or boring until you feel sleepy, then return to bed and try again.  Prioritize physical health through diet, exercise, and sleep. Regular physical activity supports cardiovascular health, improves mood, and helps preserve mobility and independence. Aim for at least 150 minutes of moderate aerobic exercise per week (e.g., brisk walking, swimming, gardening). A brain-healthy diet such as the Mediterranean or MIND diet is rich in fruits, vegetables, whole grains, healthy fats, and lean proteins, and has been associated with reduced risk of cognitive decline. Additionally, getting adequate, quality sleep and managing chronic conditions with the help of healthcare providers are essential components of healthy aging.  Continue to stay socially and mentally engaged. Maintaining strong social connections and regularly stimulating your brain can help protect against cognitive decline. This includes staying connected with friends and family, volunteering, or participating in community groups. Mentally engaging activities--such as reading, doing puzzles, playing strategy games, or learning a new language or musical instrument--promote brain plasticity. If you are interested in activities to support cognitive engagement, this site offers a variety of apps and games organized by difficulty level:  https://www.barrowneuro.org/get-to-know-barrow/centers-programs/neurorehabilitation-center/neuro-rehab-apps-and-games/  Consider implementing compensatory strategies to maximize independence and maintain daily functioning. Examples include:  Adhere to routine. Compensatory strategies work best when they are used consistently. Use a planner, calendar, or white board that has the schedule and important events for the day clearly listed to reference and cross off when  tasks are complete.  Ask for written information, especially if it is new or unfamiliar (e.g., information provided at a doctor's appointment).  Create an organized environment. Keep items that can be easily misplaced in a sensible location and get into the habit of always returning the items to those places. Pay attention and reduce distractions. Make a point of focusing attention on information you want to remember. One-on-one interaction is more likely to facilitate attention and minimize distraction. Make eye contact and repeat  the information out loud after you hear it. Reduce interruptions or distractions especially when attempting to learn new information.  Create associations. When learning something new, think about and understand the information. Explain it in your own words or try to associate it with something you already know. Take notes to help remember important details. Evaluate goals and plan accordingly. When confronted by many different tasks, begin by making a list that prioritizes each task and estimates the time it will take to complete. Break down complicated tasks into smaller, more manageable steps. Focus on one task at a time and complete each task before starting another. Avoid multitasking.  Additional resources  The [facility] Division of Aging works to promote the independence and enhance the dignity of [place] 's older adults, persons with disabilities and their families, through a community based system of opportunities, services, benefits and protections: geneblogs.si The Alzheimer's Association provides assistance and resources for individuals with a wide range of cognitive and functional impairments: limitlaws.hu If patient requires legal assistance with durable powers of attorney, medical decision making, long-term care resource access, or other aspects of estate planning, they may consider contacting [employer] at  [phone] for a free consultation.  DISPOSITION   Patient will follow up with the referring provider, Ms. Wertman. No follow-up neuropsychological testing was scheduled at this time. Please feel free to refer the patient for repeated evaluation if she shows a significant change in neurocognitive status. She and her husband will be provided verbal feedback in approximately one week regarding the findings and impression during this visit.  The remainder of the report includes the details of the patient's background and a table of results from the current evaluation, which support the summary and recommendations described above.  BACKGROUND   History of Presenting Illness: The following information was obtained from a review of medical records and an interview with the patient and her husband, Lynwood. Briefly, the patient was seen by Camie Sevin, PA-C, at [facility] on 09/27/2024 for cognitive concerns over the past two years, including difficulty remembering new information, recent conversations, and names. MoCA = 14/30. Findings of the evaluation were concerning for possible Alzheimer's disease, warranting referral for neuropsychological evaluation.  Cognitive Functioning: During today's appointment, the patient reported experiencing cognitive changes over the past two to three years, which her husband also corroborates. They noted that the first symptom observed was navigational difficulties, which were uncharacteristic for her. Since then, they have noticed a gradual decline in multiple cognitive domains. For instance, the patient reported forgetting details of conversations and misplacing items. Her husband added that she often repeats herself and frequently forgets the names and faces of people she sees at church. He noted that she writes notes to herself as a compensatory strategy, but she has accumulated so many that it has become somewhat unhelpful; some notes are no longer relevant,  although she does not realize that they need to be discarded. Patient also reported slower processing speed and, more recently, word-finding difficulties. She denied significant concerns with attention, comprehension, or executive functioning.  Physical Functioning: Patient reported increasing difficulties with sleep maintenance. Her husband noted that she naps intermittently throughout the day, which he believes may be contributing to her disrupted nighttime sleep. She even dozes off while watching television. She has a history of acting out her dreams, including a single episode over a year ago in which she fell out of bed while dreaming; no similar events have occurred since. She reported that her appetite is  not what it used to be. Her husband agreed that she does not appear to eat much, though this is not due to forgetting to eat. Her sense of smell and taste are stable. Vision and hearing are also stable. She denied any balance problems, recent falls, or tremors.  Emotional Functioning: Patient described her recent mood as generally good but acknowledged experiencing frustration related to her forgetfulness and cognitive changes, which her husband also confirmed. She denied any suicidal ideation. She enjoys doing puzzles and socializing, although she noted that recently she has been feeling less inclined to engage with others. When asked whether this change in social interest might be related to her cognitive difficulties, she acknowledged that her memory difficulties might certainly be a contributing factor.  Neuroimaging: MRI of the brain (10/25/2024) documented mild age-related volume loss, without lobar specific atrophy, and minimal chronic microvascular ischemic changes.  Other Relevant Medical History: Remarkable for REM sleep behavior disorder, atrial fibrillation, hyperlipidemia, type 2 diabetes, and chronic kidney disease stage III. Please refer to the medical record for a more  comprehensive problem list. No history of stroke, CNS infection, head injury, or seizure was reported.  Current Medications: Per patient, apixaban , acetamide, losartan , metoprolol , and vitamin B12.   Functional Status: Patient independently performs all basic and instrumental activities of daily living without difficulty. She continues to drive without reported accidents or traffic violations. When driving alone, she prefers to stay local, and for longer trips, she ensures that someone accompanies her. Her husband has observed some decline in her navigational skills; however, they report no serious instances of getting lost, as she generally remains in familiar areas. She independently manages her finances, with her husband reviewing her work and noting no issues. She also manages her medications, prepares meals, and operates household appliances without difficulty.  Family Neurological History: Unremarkable.  Psychiatric History: History of depression, anxiety, prior mental health treatment, suicidal ideation, hallucinations, and psychiatric hospitalizations was not reported.  Substance Use History: Patient denied current use of alcohol, nicotine, marijuana, and other illicit substances.. Additionally, there is no reported history of problematic substance use.  Social and Developmental History: Patient was born and raised in [place] . History of perinatal complications and developmental delays was not reported. She is married and lives with her husband. They have two sons.  Educational and Occupational History: No history of childhood learning disability, special education services, or grade retention was reported. 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 She graduated high school on time. 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 She is now  retired.  BEHAVIORAL OBSERVATIONS   Patient arrived on time and was accompanied by her husband, Lynwood. She ambulated independently and without gait disturbance. She was alert and fully oriented. She was appropriately groomed and dressed for the setting. No significant sensory or motor abnormalities were observed. Vision and hearing were adequate for testing purposes. Speech was of normal rate, prosody, and volume. No conversational word-finding difficulties, paraphasic errors, or dysarthria were observed. Comprehension was conversationally intact. Thought processes were linear, logical, and coherent. Thought content was organized and devoid of delusions. Insight appeared reduced. Affect was even and congruent with euthymic mood. She was cooperative and appeared to give adequate effort during testing, including on embedded measures of performance validity. Results are thought to accurately reflect her cognitive functioning at this time.  NEUROPSYCHOLOGICAL TESTING RESULTS   Tests Administered: Animal  Naming Test; [facility] Diagnostic Aphasia Examination (BDAE) - Subtest(s): Cookie Theft Picture; Brief Visuospatial Memory Test-Revised (BVMT-R) - Form 1; [place]  Verbal Learning Test Third Edition (CVLT3) - Brief Form; Controlled Oral Word Association Test (COWAT): FAS; Delis-Kaplan Executive Function System (D-KEFS) - Subtest(s): Color-Word Interference Test; Geriatric Anxiety Scale-10 Item (GAS-10); Geriatric Depression Scale Short Form (GDS-SF); [employer]; Neuropsychological Assessment Battery (NAB) Form 1 - Subtest(s): Naming, Figure Drawing Copy; Repeatable Battery for the Assessment of Neuropsychological Status Update (RBANS Update) Form A - Subtest(s): Line Orientation; Test of Premorbid Functioning (TOPF); Trail Making Test (TMT); Wechsler Adult Intelligence Scale Fifth Edition (WAIS-5) - Subtest(s): Similarities, Clinical Cytogeneticist, Digits Forward, Digit Sequencing, Coding, Symbol Search, Digits  Backward; and Wechsler Memory Scale Fourth Edition (WMS-IV) - Subtest(s): Logical Memory (LM).  Test results are provided in the table below. Whenever possible, the patient's scores were compared against age-, sex-, and education-corrected normative samples. Interpretive descriptions are based on the AACN consensus conference statement on uniform labeling (Guilmette et al., 2020).  PREMORBID FUNCTIONING RAW  RANGE  TOPF 27 StdS=91 Average  ATTENTION & WORKING MEMORY RAW  RANGE  WAIS-5 Digits Forward -- ss=11 Average  WAIS-5 Digits Backward -- ss=9 Average  WAIS-5 Digit Sequencing -- ss=3 Exceptionally Low  PROCESSING SPEED RAW  RANGE  Trails A 79''1e T=39 Low Average  WAIS-5 Coding  -- ss=2 Exceptionally Low  WAIS-5 Symbol Search -- ss=7 Low Average  DKEFS CWIT Color Naming 30''1e ss=12 High Average  DKEFS CWIT Word Reading 20''0e ss=13 High Average  EXECUTIVE FUNCTION RAW  RANGE  Trails B 186''1e T=49 Average  WAIS-5 Similarities -- ss=5 Below Average  COWAT Letter Fluency 7+11+11 T=55 Average  DKEFS CWIT Inhibition D/C -- --  DKEFS CWIT Inhibition/Switching D/C -- --  LANGUAGE RAW  RANGE  COWAT Letter Fluency 7+11+11 T=55 Average  Animal Naming Test 8 T=41 Low Average  NAB Naming Test 10/31 +2 w/PC T=19 BNL  BDAE Cookie Theft Picture (written) -- -- WNL  VISUOSPATIAL RAW  RANGE  RBANS Line Orientation -- <2%ile Exceptionally Low  WAIS-5 Block Design -- ss=7 Low Average  Line Bisection -- -- WNL  BVMT-R Copy Trial 7/12 -- BNL  NAB Figure Drawing Copy -- T=40 Low Average  VERBAL LEARNING & MEMORY RAW  RANGE  CVLT3 Total 1-4 (3+4+5+4)/36 StdS=73 Below Average  CVLT3 SDFR  2/9 ss=1 Exceptionally Low  CVLT3 LDFR  0/9 ss=1 Exceptionally Low  CVLT3 LDCR  3/9 ss=3 Exceptionally Low  CVLT3 Recognition Hits 3 ss=4 Below Average  CVLT3 Recognition False+ 6 ss=1 Exceptionally Low  CVLT3 Discriminability -- ss=1 Exceptionally Low  CVLT3 Intrusions 5 ss=7 Low Average  CVLT3 Repetitions  0 ss=12 High Average  CVLT3 Forced Choice 6/9 -- BNL  WMS-IV LM-I  (0+4+0)/53 ss=4 Below Average  WMS-IV LM-II  (0+0)/39 ss=1 Exceptionally Low  WMS-IV LM Recognition  (5+7)/23 3-9%ile Below Average  VISUAL LEARNING & MEMORY RAW  RANGE  BVMT-R Trial 1 1/12 T=36 Below Average  BVMT-R Trial 2 0/12 T=25 Exceptionally Low  BVMT-R Trial 3 2/12 T=30 Below Average  BVMT-R Total Recall 3/36 T=28 Exceptionally Low  BVMT-R Delayed Recall 0/12 T=26 Exceptionally Low  BVMT-R Recognition Hits 5 -- --  BVMT-R Recognition False Alarms 3 -- --  BVMT-R Recognition Discrimination Index 2 T=19 Exceptionally Low  *From Powell et al. (2022) -- -- --  QUESTIONNAIRES RAW  RANGE  GDS-SF 0 -- Minimal  GAS-10 4 -- Minimal  *Note: ss = scaled score; StdS = standard score; T = t-score;  C/S = corrected raw score; WNL = within normal limits; BNL= below normal limits; D/C = discontinued. Scores from skewed distributions are typically interpreted as WNL (>=16th %ile) or BNL (<16th %ile).   INFORMED CONSENT   Patient was provided with a verbal description of the nature and purpose of the neuropsychological evaluation. Also reviewed were the foreseeable risks and/or discomforts and benefits of the procedure, limits of confidentiality, and mandatory reporting requirements of this provider. Patient was given the opportunity to have their questions answered. Oral consent to participate was provided by the patient.   This report was prepared as part of a clinical evaluation and is not intended for forensic use.  SERVICE   This evaluation was conducted by Renda Beckwith, Psy.D. In addition to time spent directly with the patient, total professional time (120 minutes) includes record review, integration of relevant medical history, test selection, interpretation of findings, and report preparation. A technician, Lonell Jude, B.S., provided testing and scoring assistance (120 minutes).  Psychiatric Diagnostic Evaluation  Services (Professional): 09208 x 1 Neuropsychological Testing Evaluation Services (Professional): [phone] x 1 Neuropsychological Testing Evaluation Services (Professional): 03866 x 1 Neuropsychological Test Administration and Scoring Radiographer, Therapeutic): [phone] x 1 Neuropsychological Test Administration and Scoring (Technician): [phone] x 3  This report was generated using voice recognition software. While this document has been carefully reviewed, transcription errors may be present. I apologize in advance for any inconvenience. Please contact me if further clarification is needed.            Renda Beckwith, Psy.D.             Neuropsychologist  "

## 2024-11-03 NOTE — Progress Notes (Signed)
" ° °  Psychometrician Note   Cognitive testing was administered to Elizabeth Russell by Elizabeth Russell, B.S. (psychometrist) under the supervision of Dr. Renda Beckwith, Psy.D., licensed psychologist on 11/03/2024. Elizabeth Russell did not appear overtly distressed by the testing session per behavioral observation or responses across self-report questionnaires. Rest breaks were offered.   The battery of tests administered was selected by Dr. Renda Beckwith, Psy.D. with consideration to Elizabeth Russell's current level of functioning, the nature of her symptoms, emotional and behavioral responses during interview, level of literacy, observed level of motivation/effort, and the nature of the referral question. This battery was communicated to the psychometrist. Communication between Dr. Renda Beckwith, Psy.D. and the psychometrist was ongoing throughout the evaluation and Dr. Renda Beckwith, Psy.D. was immediately accessible at all times. Dr. Renda Beckwith, Psy.D. provided supervision to the psychometrist on the date of this service to the extent necessary to assure the quality of all services provided.    Elizabeth Russell will return within approximately 1-2 weeks for an interactive feedback session with Dr. Beckwith at which time her test performances, clinical impressions, and treatment recommendations will be reviewed in detail. Elizabeth Russell understands she can contact our office should she require our assistance before this time.  A total of 120 minutes of billable time were spent face-to-face with Elizabeth Russell by the psychometrist. This includes both test administration and scoring time. Billing for these services is reflected in the clinical report generated by Dr. Renda Beckwith, Psy.D.  This note reflects time spent with the psychometrician and does not include test scores or any clinical interpretations made by Dr. Beckwith. The full report will follow in a separate note. "

## 2024-11-10 ENCOUNTER — Ambulatory Visit: Payer: Self-pay | Admitting: Psychology

## 2024-11-10 DIAGNOSIS — F03A0 Unspecified dementia, mild, without behavioral disturbance, psychotic disturbance, mood disturbance, and anxiety: Secondary | ICD-10-CM | POA: Diagnosis not present

## 2024-11-10 DIAGNOSIS — F04 Amnestic disorder due to known physiological condition: Secondary | ICD-10-CM | POA: Diagnosis not present

## 2024-11-10 NOTE — Progress Notes (Signed)
" ° °  NEUROPSYCHOLOGY FEEDBACK SESSION [place]. [facility]  [place] Department of Neurology  Date of Feedback Session: 11/10/2024  REASON FOR REFERRAL   Elizabeth Russell is an 83 year old, right-handed, Black female with 12 years of formal education. She was referred for neuropsychological evaluation by Camie Sevin, PA-C, to assess current neurocognitive functioning, document potential cognitive deficits, and assist with treatment planning. This is her first neuropsychological evaluation.  FEEDBACK   Patient completed a comprehensive neuropsychological evaluation on 11/03/2024. Please refer to that encounter for the full report and recommendations. Briefly, results suggested cognitive deficits across multiple domains, most pronounced in memory and confrontation naming. Daily functioning remains largely preserved, but the severity of deficits supports a diagnosis of mild dementia, with a profile concerning for underlying Alzheimers disease. Notably, some atypical visual findings were observed; while these may reflect brain-based changes, a routine vision evaluation may be helpful to rule out contributory visual factors.  Today, the patient was accompanied by her husband. They were provided verbal feedback regarding the findings and impression during this visit, and their questions were answered. A copy of the report was provided at the conclusion of the visit.  DISPOSITION   Patient will follow up with the referring provider, Ms. Wertman. No follow-up neuropsychological testing was scheduled at this time. Please feel free to refer the patient for repeated evaluation if she shows a significant change in neurocognitive status.  SERVICE   This feedback session was conducted by Renda Beckwith, Psy.D. [place] [id number] (45 minutes) was billed for Dr. Beckwith' time spent in preparing, conducting, and documenting the current feedback session.  This report was generated using voice recognition  software. While this document has been carefully reviewed, transcription errors may be present. I apologize in advance for any inconvenience. Please contact me if further clarification is needed.  "

## 2024-12-29 ENCOUNTER — Ambulatory Visit: Payer: Self-pay | Admitting: Physician Assistant
